# Patient Record
Sex: Male | Born: 1935 | Race: White | Hispanic: No | Marital: Married | Smoking: Never smoker
Health system: Southern US, Community
[De-identification: ages and names within clinical notes are randomized; demographics above are authoritative.]

## PROBLEM LIST (undated history)

## (undated) DIAGNOSIS — N400 Enlarged prostate without lower urinary tract symptoms: Secondary | ICD-10-CM

## (undated) DIAGNOSIS — E0590 Thyrotoxicosis, unspecified without thyrotoxic crisis or storm: Secondary | ICD-10-CM

## (undated) DIAGNOSIS — F32A Depression, unspecified: Secondary | ICD-10-CM

## (undated) DIAGNOSIS — K219 Gastro-esophageal reflux disease without esophagitis: Secondary | ICD-10-CM

## (undated) DIAGNOSIS — I340 Nonrheumatic mitral (valve) insufficiency: Secondary | ICD-10-CM

## (undated) DIAGNOSIS — Z22322 Carrier or suspected carrier of Methicillin resistant Staphylococcus aureus: Secondary | ICD-10-CM

## (undated) DIAGNOSIS — J45909 Unspecified asthma, uncomplicated: Secondary | ICD-10-CM

## (undated) DIAGNOSIS — G4733 Obstructive sleep apnea (adult) (pediatric): Secondary | ICD-10-CM

## (undated) DIAGNOSIS — I482 Chronic atrial fibrillation, unspecified: Secondary | ICD-10-CM

## (undated) DIAGNOSIS — Z950 Presence of cardiac pacemaker: Secondary | ICD-10-CM

## (undated) DIAGNOSIS — E559 Vitamin D deficiency, unspecified: Secondary | ICD-10-CM

## (undated) DIAGNOSIS — E119 Type 2 diabetes mellitus without complications: Secondary | ICD-10-CM

## (undated) DIAGNOSIS — F329 Major depressive disorder, single episode, unspecified: Secondary | ICD-10-CM

## (undated) DIAGNOSIS — G459 Transient cerebral ischemic attack, unspecified: Secondary | ICD-10-CM

## (undated) DIAGNOSIS — E785 Hyperlipidemia, unspecified: Secondary | ICD-10-CM

## (undated) DIAGNOSIS — C61 Malignant neoplasm of prostate: Secondary | ICD-10-CM

## (undated) HISTORY — PX: CHOLECYSTECTOMY: SHX55

## (undated) HISTORY — PX: INGUINAL HERNIA REPAIR: SUR1180

---

## 2004-07-09 ENCOUNTER — Encounter: Admission: RE | Admit: 2004-07-09 | Discharge: 2004-07-09 | Payer: Self-pay | Admitting: Urology

## 2005-08-15 ENCOUNTER — Ambulatory Visit: Payer: Self-pay | Admitting: Internal Medicine

## 2005-09-23 ENCOUNTER — Ambulatory Visit: Payer: Self-pay | Admitting: Internal Medicine

## 2005-09-23 ENCOUNTER — Inpatient Hospital Stay (HOSPITAL_COMMUNITY): Admission: RE | Admit: 2005-09-23 | Discharge: 2005-09-24 | Payer: Self-pay | Admitting: Internal Medicine

## 2005-10-08 ENCOUNTER — Ambulatory Visit: Payer: Self-pay

## 2005-10-15 ENCOUNTER — Ambulatory Visit: Payer: Self-pay | Admitting: Internal Medicine

## 2005-11-19 ENCOUNTER — Ambulatory Visit: Payer: Self-pay | Admitting: Cardiology

## 2006-01-02 ENCOUNTER — Ambulatory Visit: Payer: Self-pay | Admitting: Internal Medicine

## 2007-10-21 ENCOUNTER — Ambulatory Visit (HOSPITAL_COMMUNITY): Admission: RE | Admit: 2007-10-21 | Discharge: 2007-10-21 | Payer: Self-pay | Admitting: Urology

## 2008-12-28 ENCOUNTER — Encounter (INDEPENDENT_AMBULATORY_CARE_PROVIDER_SITE_OTHER): Payer: Self-pay | Admitting: *Deleted

## 2010-04-05 ENCOUNTER — Encounter (INDEPENDENT_AMBULATORY_CARE_PROVIDER_SITE_OTHER): Payer: Self-pay | Admitting: *Deleted

## 2010-10-10 NOTE — Letter (Signed)
Summary: Appointment - Reminder 2  [employer], Main Office  [address]   [place], [place] [address]   Phone: [phone]  Fax: [phone]     April 05, 2010 MRN: [id number]   DINA MOBLEY [address] [place], [place]  [address]   Dear Mr. Abdella,  Our records indicate that it is time to schedule a follow-up appointment.  Dr.Klein recommended that you follow up with [place]. It is very important that we reach you to schedule this appointment. We look forward to participating in your health care needs. Please contact us at the number listed above at your earliest convenience to schedule your appointment.  If you are unable to make an appointment at this time, give [place] a call so we can update our records.     Sincerely,  Ruel Favors Scheduling Team

## 2011-01-24 NOTE — Op Note (Signed)
NAME:  KJ, IMBERT NO.:  [id number]   MEDICAL RECORD NO.:  [id number]          PATIENT TYPE:  OIB   LOCATION:  4734                         FACILITY:  [facility]   PHYSICIAN:  Duke Salvia, M.D.  DATE OF BIRTH:  07-31-1929   DATE OF PROCEDURE:  09/23/2005  DATE OF DISCHARGE:                                 OPERATIVE REPORT   PREOPERATIVE DIAGNOSIS:  Tachy-brady syndrome.   POSTOPERATIVE DIAGNOSIS:  Tachy-brady syndrome.   OPERATION PERFORMED:  Single chamber pacemaker implantation.   SURGEON:  Duke Salvia, M.D.   DESCRIPTION OF PROCEDURE:  Following the obtaining of informed consent, the  patient was brought to the electrophysiology laboratory and placed on the  fluoroscopic table in supine position.  After routine prep and drape of the  left upper chest, lidocaine was infiltrated in the prepectoral subclavicular  region.  Incision was made and carried down to the layer of the prepectoral  fascia using the electrocautery and sharp dissection.  A pocket was formed  similarly.   Hemostasis was obtained.  Thereafter attention was turned to gaining access  to the extrathoracic left subclavian vein which was accomplished without  difficulty, without the aspiration of air or puncture of the artery.  A  guidewire was placed and retained.  A 7 French sheath was placed through  which was then passed a Medtronic 5076 58 cm active fixation ventricular  lead serial number [id number].  Under fluoroscopic guidance it was  manipulated to the right ventricular septum as defined in the LAO  projection.  In this location, the bipolar R-wave was initially 12 mV with  stable impedance.  This lead was then secured to the prepectoral fascia and  attached to a Medtronic Impulse [id number] pulse generator, serial number  [id number].  Just prior to attachment, the bipolar R-wave was 7.2 mV with a  pacing impedance of 659 ohms and pacing threshold of 0.6 V at 0.5 msec.  Current at  threshold is 1.1 mA.   With these acceptable parameters recorded, the lead was secured to the  device as noted.  The pocket was copiously irrigated with antibiotic  containing solution.  Hemostasis was assured and the lead and the pulse  generator were placed in the pocket and secured to the prepectoral fascia.  The wound was then closed in three layers in normal  fashion.  The wound was washed, dried and a benzoin Steri-Strip dressing was  applied.  Sponge count, needle count and instrument count were correct at  the end of the procedure according to the staff.  The patient tolerated the  procedure without apparent complication.           ______________________________  Duke Salvia, M.D.     SCK/MEDQ  D:  09/23/2005  T:  09/24/2005  Job:  [id number]   cc:   Heide Guile, MD  Fax: [phone]   Electrophys lab   [facility]

## 2011-01-24 NOTE — Discharge Summary (Signed)
NAME:  Patrick Drake, Patrick Drake NO.:  [id number]   MEDICAL RECORD NO.:  [id number]          PATIENT TYPE:  OIB   LOCATION:  4734                         FACILITY:  [facility]   PHYSICIAN:  Duke Salvia, M.D.  DATE OF BIRTH:  08-01-1929   DATE OF ADMISSION:  09/23/2005  DATE OF DISCHARGE:  09/24/2005                                 DISCHARGE SUMMARY   PRINCIPAL DIAGNOSES:  1.  Tachy-brady syndrome with Holter monitor pauses up to greater than 3      seconds.  2.  Chronic atrial fibrillation, chronic Coumadin.  3.  Implantation of Medtronic EnPulse single chamber pacemaker, September 23, 2005 Dr. Sherryl Manges.  4.  Elevated heart rates requiring addition of AV nodal blocking agents for      rate control this hospitalization.  5.  Chest x-ray September 23, 2005. There is a question of a small density at      the right base. This could easily be an artifact but follow up chest x-      ray or CT study is recommended if the feature, which is possibly a cold      right lower lobe nodule, persists on follow up chest x-ray. Patient to      consult with primary caregiver for this.   SECONDARY DIAGNOSES:  1.  Hyperthyroidism.  2.  Hypertension.  3.  Cardiolite study May 2006 which showed ejection fraction of 65%, normal      left ventricular wall thickness, normal left ventricular wall motion,      moderate mitral regurgitation.  4.  History of prostate cancer.  5.  Possible occult right lower lobe nodule on chest x-ray September 23, 2005.   ALLERGIES:  No known drug allergies.   PROCEDURES:  September 23, 2005 implantation of Medtronic single chamber  pacemaker, Dr. Sherryl Manges. Post procedure interrogation of the pacemaker  shows that all values were within normal limits. Chest x-ray has been  examined and shows the leads are appropriately placed. The incision without  hematoma.   HISTORY AND PHYSICAL:  Patrick Drake has a history of atrial fibrillation  which is  persistent. It has tachy-brady manifestations with actual pauses on  Holter monitor. He is currently treated for hyperthyroidism and hypertension  and is on chronic Coumadin. Cardiac evaluation included a stress  echocardiogram which was normal. Ejection fraction greater than 65%. This  was confirmed by a Myoview scan May 2006. The patient has been referred for  implantation of pacemaker for tachy-brady syndrome. The risks and benefits  have been described to the patient.   HOSPITAL COURSE:  The patient presented electively September 23, 2005. He  underwent implantation of Medtronic single chamber pacemaker. He has had  some elevated heart rate in the post pacemaker implantation period. He will  be placed on both atenolol and Inderal at discharge. His incision is healing  nicely without evidence of hematoma, erythema, or drainage. He is alert and  oriented x3 and continues in atrial fibrillation. PT and INR this admission  on January 17th it is 14.9 and INR 1.2.   Laboratory studies  this admission which were obtained on September 16, 2005 are  complete blood count white cells 6, hemoglobin 15.6, hematocrit 45.6,  platelets 144. The serum electrolytes of January 9 are sodium is 137,  potassium 5.1, chloride 102, carbonate 27, BUN is 13, creatinine 1.2,  glucose 86.   CC: Dr. Foye Deer, [facility], [place], [facility]      [place], [place].    ______________________________  Duke Salvia, M.D.    GM/MEDQ  D:  09/24/2005  T:  09/24/2005  Job:  [id number]   cc:   Duke Salvia, M.D.  [address]  [address]  [place]  [place] [address]   Heide Guile, MD  Fax: [phone]

## 2015-06-16 ENCOUNTER — Encounter (HOSPITAL_COMMUNITY): Payer: Self-pay | Admitting: Emergency Medicine

## 2015-06-16 ENCOUNTER — Inpatient Hospital Stay (HOSPITAL_COMMUNITY)
Admission: EM | Admit: 2015-06-16 | Discharge: 2015-06-19 | DRG: 556 | Disposition: A | Discharge status: Skilled Nursing Facility | Payer: Commercial Managed Care - HMO | Attending: Internal Medicine | Admitting: Internal Medicine

## 2015-06-16 ENCOUNTER — Emergency Department (HOSPITAL_COMMUNITY)
Admit: 2015-06-16 | Discharge: 2015-06-16 | Disposition: A | Discharge status: Home / Self Care | Payer: Commercial Managed Care - HMO | Attending: Emergency Medicine | Admitting: Emergency Medicine

## 2015-06-16 DIAGNOSIS — M7989 Other specified soft tissue disorders: Secondary | ICD-10-CM | POA: Diagnosis present

## 2015-06-16 DIAGNOSIS — M21331 Wrist drop, right wrist: Secondary | ICD-10-CM | POA: Diagnosis present

## 2015-06-16 DIAGNOSIS — Z7901 Long term (current) use of anticoagulants: Secondary | ICD-10-CM | POA: Diagnosis not present

## 2015-06-16 DIAGNOSIS — E0590 Thyrotoxicosis, unspecified without thyrotoxic crisis or storm: Secondary | ICD-10-CM

## 2015-06-16 DIAGNOSIS — Z923 Personal history of irradiation: Secondary | ICD-10-CM

## 2015-06-16 DIAGNOSIS — E785 Hyperlipidemia, unspecified: Secondary | ICD-10-CM | POA: Diagnosis present

## 2015-06-16 DIAGNOSIS — K219 Gastro-esophageal reflux disease without esophagitis: Secondary | ICD-10-CM | POA: Diagnosis present

## 2015-06-16 DIAGNOSIS — L02415 Cutaneous abscess of right lower limb: Secondary | ICD-10-CM | POA: Diagnosis present

## 2015-06-16 DIAGNOSIS — R2231 Localized swelling, mass and lump, right upper limb: Secondary | ICD-10-CM | POA: Diagnosis not present

## 2015-06-16 DIAGNOSIS — Z950 Presence of cardiac pacemaker: Secondary | ICD-10-CM

## 2015-06-16 DIAGNOSIS — Z66 Do not resuscitate: Secondary | ICD-10-CM | POA: Diagnosis present

## 2015-06-16 DIAGNOSIS — Z8673 Personal history of transient ischemic attack (TIA), and cerebral infarction without residual deficits: Secondary | ICD-10-CM | POA: Diagnosis not present

## 2015-06-16 DIAGNOSIS — M79601 Pain in right arm: Secondary | ICD-10-CM

## 2015-06-16 DIAGNOSIS — D649 Anemia, unspecified: Secondary | ICD-10-CM | POA: Diagnosis present

## 2015-06-16 DIAGNOSIS — E1122 Type 2 diabetes mellitus with diabetic chronic kidney disease: Secondary | ICD-10-CM

## 2015-06-16 DIAGNOSIS — B9561 Methicillin susceptible Staphylococcus aureus infection as the cause of diseases classified elsewhere: Secondary | ICD-10-CM

## 2015-06-16 DIAGNOSIS — I482 Chronic atrial fibrillation: Secondary | ICD-10-CM

## 2015-06-16 DIAGNOSIS — N189 Chronic kidney disease, unspecified: Secondary | ICD-10-CM

## 2015-06-16 DIAGNOSIS — I10 Essential (primary) hypertension: Secondary | ICD-10-CM

## 2015-06-16 DIAGNOSIS — R58 Hemorrhage, not elsewhere classified: Secondary | ICD-10-CM | POA: Diagnosis present

## 2015-06-16 DIAGNOSIS — Z8546 Personal history of malignant neoplasm of prostate: Secondary | ICD-10-CM

## 2015-06-16 DIAGNOSIS — N179 Acute kidney failure, unspecified: Secondary | ICD-10-CM | POA: Diagnosis present

## 2015-06-16 DIAGNOSIS — E559 Vitamin D deficiency, unspecified: Secondary | ICD-10-CM | POA: Insufficient documentation

## 2015-06-16 DIAGNOSIS — R609 Edema, unspecified: Secondary | ICD-10-CM

## 2015-06-16 DIAGNOSIS — I129 Hypertensive chronic kidney disease with stage 1 through stage 4 chronic kidney disease, or unspecified chronic kidney disease: Secondary | ICD-10-CM | POA: Diagnosis present

## 2015-06-16 DIAGNOSIS — I4891 Unspecified atrial fibrillation: Secondary | ICD-10-CM

## 2015-06-16 DIAGNOSIS — N400 Enlarged prostate without lower urinary tract symptoms: Secondary | ICD-10-CM | POA: Diagnosis present

## 2015-06-16 DIAGNOSIS — Z79899 Other long term (current) drug therapy: Secondary | ICD-10-CM

## 2015-06-16 DIAGNOSIS — J45909 Unspecified asthma, uncomplicated: Secondary | ICD-10-CM | POA: Insufficient documentation

## 2015-06-16 DIAGNOSIS — F32A Depression, unspecified: Secondary | ICD-10-CM | POA: Insufficient documentation

## 2015-06-16 DIAGNOSIS — F329 Major depressive disorder, single episode, unspecified: Secondary | ICD-10-CM | POA: Diagnosis present

## 2015-06-16 DIAGNOSIS — Z9989 Dependence on other enabling machines and devices: Secondary | ICD-10-CM

## 2015-06-16 DIAGNOSIS — Z794 Long term (current) use of insulin: Secondary | ICD-10-CM

## 2015-06-16 DIAGNOSIS — M21339 Wrist drop, unspecified wrist: Secondary | ICD-10-CM

## 2015-06-16 DIAGNOSIS — G4733 Obstructive sleep apnea (adult) (pediatric): Secondary | ICD-10-CM

## 2015-06-16 DIAGNOSIS — E119 Type 2 diabetes mellitus without complications: Secondary | ICD-10-CM

## 2015-06-16 DIAGNOSIS — C61 Malignant neoplasm of prostate: Secondary | ICD-10-CM | POA: Insufficient documentation

## 2015-06-16 DIAGNOSIS — N289 Disorder of kidney and ureter, unspecified: Secondary | ICD-10-CM

## 2015-06-16 HISTORY — DX: Chronic atrial fibrillation, unspecified: I48.20

## 2015-06-16 HISTORY — DX: Unspecified asthma, uncomplicated: J45.909

## 2015-06-16 HISTORY — DX: Malignant neoplasm of prostate: C61

## 2015-06-16 HISTORY — DX: Nonrheumatic mitral (valve) insufficiency: I34.0

## 2015-06-16 HISTORY — DX: Presence of cardiac pacemaker: Z95.0

## 2015-06-16 HISTORY — DX: Transient cerebral ischemic attack, unspecified: G45.9

## 2015-06-16 HISTORY — DX: Thyrotoxicosis, unspecified without thyrotoxic crisis or storm: E05.90

## 2015-06-16 HISTORY — DX: Depression, unspecified: F32.A

## 2015-06-16 HISTORY — DX: Type 2 diabetes mellitus without complications: E11.9

## 2015-06-16 HISTORY — DX: Carrier or suspected carrier of methicillin resistant Staphylococcus aureus: Z22.322

## 2015-06-16 HISTORY — DX: Hyperlipidemia, unspecified: E78.5

## 2015-06-16 HISTORY — DX: Benign prostatic hyperplasia without lower urinary tract symptoms: N40.0

## 2015-06-16 HISTORY — DX: Obstructive sleep apnea (adult) (pediatric): G47.33

## 2015-06-16 HISTORY — DX: Vitamin D deficiency, unspecified: E55.9

## 2015-06-16 HISTORY — DX: Major depressive disorder, single episode, unspecified: F32.9

## 2015-06-16 HISTORY — DX: Gastro-esophageal reflux disease without esophagitis: K21.9

## 2015-06-16 LAB — BASIC METABOLIC PANEL
Anion gap: 9 (ref 5–15)
BUN: 31 mg/dL — ABNORMAL HIGH (ref 6–20)
CO2: 26 mmol/L (ref 22–32)
Calcium: 9.1 mg/dL (ref 8.9–10.3)
Chloride: 103 mmol/L (ref 101–111)
Creatinine, Ser: 1.46 mg/dL — ABNORMAL HIGH (ref 0.61–1.24)
GFR calc Af Amer: 49 mL/min — ABNORMAL LOW (ref >60)
GFR calc non Af Amer: 42 mL/min — ABNORMAL LOW (ref >60)
Glucose, Bld: 111 mg/dL — ABNORMAL HIGH (ref 65–99)
Potassium: 4 mmol/L (ref 3.5–5.1)
Sodium: 138 mmol/L (ref 135–145)

## 2015-06-16 LAB — CBC
HCT: 29.2 % — ABNORMAL LOW (ref 39.0–52.0)
HEMOGLOBIN: 9.3 g/dL — AB (ref 13.0–17.0)
MCH: 31.4 pg (ref 26.0–34.0)
MCHC: 31.8 g/dL (ref 30.0–36.0)
MCV: 98.6 fL (ref 78.0–100.0)
Platelets: 252 10*3/uL (ref 150–400)
RBC: 2.96 MIL/uL — AB (ref 4.22–5.81)
RDW: 18 % — ABNORMAL HIGH (ref 11.5–15.5)
WBC: 8.1 10*3/uL (ref 4.0–10.5)

## 2015-06-16 LAB — GLUCOSE, CAPILLARY
GLUCOSE-CAPILLARY: 93 mg/dL (ref 65–99)
GLUCOSE-CAPILLARY: 175 mg/dL — AB (ref 65–99)
GLUCOSE-CAPILLARY: 159 mg/dL — AB (ref 65–99)
Glucose-Capillary: 116 mg/dL — ABNORMAL HIGH (ref 65–99)
Glucose-Capillary: 47 mg/dL — ABNORMAL LOW (ref 65–99)

## 2015-06-16 LAB — ABO/RH: ABO/RH(D): A NEG

## 2015-06-16 LAB — CBC WITH DIFFERENTIAL/PLATELET
Basophils Absolute: 0 10*3/uL (ref 0.0–0.1)
Basophils Relative: 0 %
Eosinophils Absolute: 0.1 10*3/uL (ref 0.0–0.7)
Eosinophils Relative: 1 %
HCT: 23.5 % — ABNORMAL LOW (ref 39.0–52.0)
Hemoglobin: 7.5 g/dL — ABNORMAL LOW (ref 13.0–17.0)
Lymphocytes Relative: 15 %
Lymphs Abs: 1.1 10*3/uL (ref 0.7–4.0)
MCH: 31.9 pg (ref 26.0–34.0)
MCHC: 31.9 g/dL (ref 30.0–36.0)
MCV: 100 fL (ref 78.0–100.0)
Monocytes Absolute: 0.5 10*3/uL (ref 0.1–1.0)
Monocytes Relative: 7 %
Neutro Abs: 5.7 10*3/uL (ref 1.7–7.7)
Neutrophils Relative %: 77 %
Platelets: 265 10*3/uL (ref 150–400)
RBC: 2.35 MIL/uL — ABNORMAL LOW (ref 4.22–5.81)
RDW: 18 % — ABNORMAL HIGH (ref 11.5–15.5)
WBC: 7.4 10*3/uL (ref 4.0–10.5)

## 2015-06-16 LAB — PREPARE RBC (CROSSMATCH)

## 2015-06-16 LAB — CBG MONITORING, ED
Glucose-Capillary: 68 mg/dL (ref 65–99)
Glucose-Capillary: 109 mg/dL — ABNORMAL HIGH (ref 65–99)

## 2015-06-16 LAB — PROTIME-INR
INR: 1.92 — ABNORMAL HIGH (ref 0.00–1.49)
Prothrombin Time: 21.9 seconds — ABNORMAL HIGH (ref 11.6–15.2)

## 2015-06-16 LAB — MRSA PCR SCREENING: MRSA by PCR: NEGATIVE

## 2015-06-16 MED ORDER — VENLAFAXINE HCL ER 75 MG PO CP24
75.0000 mg | ORAL_CAPSULE | Freq: Every day | ORAL | Status: DC
  Administered 2015-06-16 – 2015-06-19 (×4): 75 mg via ORAL
  Filled 2015-06-16 (×5): qty 1

## 2015-06-16 MED ORDER — INSULIN DETEMIR 100 UNIT/ML ~~LOC~~ SOLN
20.0000 [IU] | Freq: Every day | SUBCUTANEOUS | Status: DC
  Administered 2015-06-16 – 2015-06-17 (×2): 20 [IU] via SUBCUTANEOUS
  Filled 2015-06-16 (×3): qty 0.2

## 2015-06-16 MED ORDER — FLUTICASONE FUROATE-VILANTEROL 200-25 MCG/INH IN AEPB: 1.0000 | INHALATION_SPRAY | Freq: Every day | RESPIRATORY_TRACT | Status: DC

## 2015-06-16 MED ORDER — INSULIN ASPART 100 UNIT/ML ~~LOC~~ SOLN: 0.0000 [IU] | Freq: Three times a day (TID) | SUBCUTANEOUS | Status: DC

## 2015-06-16 MED ORDER — FUROSEMIDE 40 MG PO TABS
40.0000 mg | ORAL_TABLET | Freq: Every day | ORAL | Status: DC
  Administered 2015-06-16: 40 mg via ORAL
  Filled 2015-06-16: qty 1

## 2015-06-16 MED ORDER — DEXTROSE 50 % IV SOLN
INTRAVENOUS | Status: AC
  Administered 2015-06-16: 50 mL
  Filled 2015-06-16: qty 50

## 2015-06-16 MED ORDER — INSULIN DETEMIR 100 UNIT/ML ~~LOC~~ SOLN
40.0000 [IU] | Freq: Every day | SUBCUTANEOUS | Status: DC
  Filled 2015-06-16: qty 0.4

## 2015-06-16 MED ORDER — CLINDAMYCIN HCL 300 MG PO CAPS
600.0000 mg | ORAL_CAPSULE | Freq: Three times a day (TID) | ORAL | Status: AC
  Administered 2015-06-16 (×2): 600 mg via ORAL
  Filled 2015-06-16 (×2): qty 2

## 2015-06-16 MED ORDER — METHIMAZOLE 5 MG PO TABS
5.0000 mg | ORAL_TABLET | ORAL | Status: DC
Start: 2015-06-17 — End: 2015-06-19
  Administered 2015-06-17 – 2015-06-19 (×2): 5 mg via ORAL
  Filled 2015-06-16 (×3): qty 1

## 2015-06-16 MED ORDER — RISAQUAD PO CAPS
1.0000 | ORAL_CAPSULE | Freq: Two times a day (BID) | ORAL | Status: DC
  Administered 2015-06-16 – 2015-06-18 (×5): 1 via ORAL
  Filled 2015-06-16 (×5): qty 1

## 2015-06-16 MED ORDER — ALBUTEROL SULFATE (2.5 MG/3ML) 0.083% IN NEBU: 3.0000 mL | INHALATION_SOLUTION | Freq: Four times a day (QID) | RESPIRATORY_TRACT | Status: DC | PRN

## 2015-06-16 MED ORDER — FLORA-Q PO CAPS
1.0000 | ORAL_CAPSULE | Freq: Two times a day (BID) | ORAL | Status: DC
  Filled 2015-06-16: qty 1

## 2015-06-16 MED ORDER — SODIUM CHLORIDE 0.9 % IV SOLN
Freq: Once | INTRAVENOUS | Status: AC
  Administered 2015-06-16: 11:00:00 via INTRAVENOUS

## 2015-06-16 MED ORDER — SODIUM CHLORIDE 0.9 % IV SOLN
Freq: Once | INTRAVENOUS | Status: AC
  Administered 2015-06-16: 16:00:00 via INTRAVENOUS

## 2015-06-16 MED ORDER — ATENOLOL 50 MG PO TABS
75.0000 mg | ORAL_TABLET | Freq: Every day | ORAL | Status: DC
  Administered 2015-06-17 – 2015-06-19 (×3): 75 mg via ORAL
  Filled 2015-06-16 (×4): qty 2

## 2015-06-16 MED ORDER — GUAIFENESIN-DM 100-10 MG/5ML PO SYRP: 10.0000 mL | ORAL_SOLUTION | Freq: Four times a day (QID) | ORAL | Status: DC | PRN

## 2015-06-16 MED ORDER — DEXTROSE 50 % IV SOLN
12.5000 g | Freq: Once | INTRAVENOUS | Status: AC
  Administered 2015-06-16: 12.5 g via INTRAVENOUS

## 2015-06-16 MED ORDER — FAMOTIDINE 20 MG PO TABS
20.0000 mg | ORAL_TABLET | Freq: Every day | ORAL | Status: DC
  Administered 2015-06-16 – 2015-06-19 (×4): 20 mg via ORAL
  Filled 2015-06-16 (×5): qty 1

## 2015-06-16 MED ORDER — ALUM & MAG HYDROXIDE-SIMETH 200-200-20 MG/5ML PO SUSP: 30.0000 mL | ORAL | Status: DC | PRN

## 2015-06-16 MED ORDER — INFLUENZA VAC SPLIT QUAD 0.5 ML IM SUSY
0.5000 mL | PREFILLED_SYRINGE | INTRAMUSCULAR | Status: AC
  Administered 2015-06-19: 0.5 mL via INTRAMUSCULAR
  Filled 2015-06-16 (×2): qty 0.5

## 2015-06-16 MED ORDER — ACETAMINOPHEN 650 MG RE SUPP: 650.0000 mg | Freq: Four times a day (QID) | RECTAL | Status: DC | PRN

## 2015-06-16 MED ORDER — INSULIN ASPART 100 UNIT/ML ~~LOC~~ SOLN: 0.0000 [IU] | Freq: Every day | SUBCUTANEOUS | Status: DC

## 2015-06-16 MED ORDER — ATORVASTATIN CALCIUM 40 MG PO TABS
40.0000 mg | ORAL_TABLET | Freq: Every day | ORAL | Status: DC
  Administered 2015-06-16 – 2015-06-19 (×4): 40 mg via ORAL
  Filled 2015-06-16 (×4): qty 1

## 2015-06-16 MED ORDER — ACETAMINOPHEN 325 MG PO TABS: 650.0000 mg | ORAL_TABLET | Freq: Four times a day (QID) | ORAL | Status: DC | PRN

## 2015-06-16 MED ORDER — MOMETASONE FURO-FORMOTEROL FUM 100-5 MCG/ACT IN AERO
2.0000 | INHALATION_SPRAY | Freq: Two times a day (BID) | RESPIRATORY_TRACT | Status: DC
  Administered 2015-06-16 – 2015-06-18 (×5): 2 via RESPIRATORY_TRACT
  Filled 2015-06-16: qty 8.8

## 2015-06-16 NOTE — H&P (Signed)
Date: 06/16/2015               Patient Name:  Patrick Drake MRN: [id number]  DOB: 1929-08-16 Age / Sex: 81 y.o., male   PCP: No primary care provider on file.         Medical Service: Internal Medicine Teaching Service         Attending Physician: Dr. Michel Bickers, MD    First Contact: Dr. Charlynn Grimes Pager: [phone]  Second Contact: Dr. Marvel Plan Pager: [phone]       After Hours (After 5p/  First Contact Pager: [phone]  weekends / holidays): Second Contact Pager: [phone]   Chief Complaint: Right arm swelling  History of Present Illness: [name] is an 81 year old male with a PMH of atrial fibrillation on warfarin, insulin dependent diabetes mellitus type 2, hypertension, TIA, hyperlipidemia and hyperthyroidism who presents to [facility] ED with 1 week history of right arm swelling. Patient reports that over the past week he developed a knot on the back right arm that began as a bruise with progressive swelling. He denies any pain. His right hand became swollen with purple discoloration in the past day. His right arm was noted to be purple, cool to the touch with a weak radial pulse. He had an US done which was read as a brachial artery occulusion and sent to the ED.  Upon arrival, vascular duplex scan was read by the ED physician, Dr. Roxanne Mins, as an anechoic area with no evidence of arterial occulusion. Denies any trauma.   Venous and arterial duplex of the right arm in the ED show no evidence of DVT or SVT with no stenosis and normal waveforms throughout.   Patient was recently hospitalized. Cannot recall the details of the hospitalization. Appears he had an abscess on his right upper thigh that was drained. Wound vac in place. He is on day 10 of clindamycin per nursing facility medication list. He is a resident of [facility] in [place].   Meds: No current facility-administered medications for this encounter.    Allergies: Allergies as of 06/16/2015  . (No Known Allergies)    Past Medical History  Diagnosis Date  . Diabetes mellitus without complication ([place])   . TIA (transient ischemic attack)   . MI (mitral incompetence)   . MRSA (methicillin resistant staph aureus) culture positive   . Depression   . Atrial fibrillation, chronic ([place])   . Pacemaker   . Hyperlipemia   . HTN (hypertension)   . BPH (benign prostatic hyperplasia)   . Prostate CA [facility])     s/p radiation 2009  . GERD (gastroesophageal reflux disease)   . Hyperthyroidism   . OSA (obstructive sleep apnea)   . Vitamin D deficiency   . Asthma    Past Surgical History  Procedure Laterality Date  . Cholecystectomy    . Inguinal hernia repair     No family history on file. Social History   Social History  . Marital Status: Married    Spouse Name: N/A  . Number of Children: N/A  . Years of Education: N/A   Occupational History  . Not on file.   Social History Main Topics  . Smoking status: Not on file  . Smokeless tobacco: Not on file  . Alcohol Use: Not on file  . Drug Use: Not on file  . Sexual Activity: Not on file   Other Topics Concern  . Not on file   Social History Narrative  .  No narrative on file   Review of Systems: Review of Systems  Constitutional: Positive for chills. Negative for fever and weight loss.  Eyes: Negative for blurred vision and double vision.  Respiratory: Negative for cough, shortness of breath and wheezing.   Cardiovascular: Negative for chest pain and palpitations.  Gastrointestinal: Negative for nausea, vomiting, abdominal pain, diarrhea and constipation.  Genitourinary: Negative for dysuria and hematuria.  Skin: Negative for itching and rash.  Neurological: Positive for headaches. Negative for dizziness, focal weakness and weakness.  Endo/Heme/Allergies: Bruises/bleeds easily.  Psychiatric/Behavioral: Negative for depression.   Physical Exam: Blood pressure 134/63, pulse 69, temperature 97.6 F (36.4 C), temperature source Oral,  resp. rate 19, SpO2 97 %. General appearance: alert, cooperative, appears stated age and no distress Head: Normocephalic, without obvious abnormality, atraumatic Eyes: conjunctivae/corneas clear. PERRL, EOM's intact. Throat: lips, mucosa, and tongue normal; teeth and gums normal Neck: no adenopathy, no carotid bruit and no JVD Back: symmetric, no curvature. ROM normal. No CVA tenderness. Lungs: clear to auscultation bilaterally, no rales or wheezing Heart: regular rate and rhythm, S1, S2 normal, no murmur, click, rub or gallop Abdomen: soft, non-tender; bowel sounds normal; no masses,  no organomegaly Pulses: 2+ and symmetric Skin: Warm, dry. Right thigh wound vac in place.  Neurologic: Alert and oriented X 3, normal strength and tone. Normal symmetric reflexes. Normal coordination and gait Extremities: Right arm swollen with ecchymosis to shoulder. Non-tender to palpation. Limited range of movement 2/2 pain in forearm with movement. Sensation intact. No masses palpated. Right hand cool to touch but normal capillary refill. 1+ radial pulse.      Lab results: Basic Metabolic Panel:  Recent Labs  06/16/15 0650  NA 138  K 4.0  CL 103  CO2 26  GLUCOSE 111*  BUN 31*  CREATININE 1.46*  CALCIUM 9.1   CBC:  Recent Labs  06/16/15 0650  WBC 7.4  NEUTROABS 5.7  HGB 7.5*  HCT 23.5*  MCV 100.0  PLT 265   CBG:  Recent Labs  06/16/15 0806 06/16/15 0933  GLUCAP 68 109*   Coagulation:  Recent Labs  06/16/15 0650  LABPROT 21.9*  INR 1.92*   Imaging results:  No results found.  Assessment & Plan by Problem:  [name] is an 81 year old male with a history of A. Fib on warfarin presents with 1 week history of right arm swelling and ecchymosis.   Swelling and Ecchymosis in RUE: [name] has progressive RUE swelling with ecchymosis for the past week. He is on chronic anticoagulation therapy for his A. Fib. He was recently admitted to [facility] in  [place], [place] for a RLE MRSA abscess that was drained. Dopplers done today show no evidence of DVT or SVT with no stenosis and normal waveforms. Likely that he is having a chronic bleed in that arm causing the swelling. Possible that he had an IV site in that arm during previous hospitalization that is the root cause of the bleed. It appears his coumadin was held prior to I&D and he was discharged with a Lovenox bridge until his INR returned to therapeutic range. INR today was 1.92. Discharge Hgb was 11.4. Hgb today at 7.5. No symptoms concerning for compartment syndrome.  -Will give 2 units PRBC for suspected active bleed -Daily CBC -Holding warfarin -Daily INR  Normochromic normocytic anemia: per above. Likely active bleeding in right arm. Holding anti-coaguation. Trend H/H. Will get 2 units PRBC.  DM (diabetes mellitus) type 2: Last A1c 8.1  in September 2016. Patient's home regimen is Levemir 50 units bid with glimepridie 4 mg daily. CBGs have been low here. Will monitor closely.  -Levemir 40 units qhs -SSI-moderate -CBGs qac and qhs   Atrial fibrillation: Patient with chronic A. Fib on warfarin. Warfarin temporarily held at last hospitalization prior to I&D.  AKI (acute kidney injury) on CKD: Patient's baseline Cr is around 1.3. Mildly elevated at 1.46 on admission. Will try to get further records from [facility].  -Daily BMET -Monitor  Hyperthyroidism: On methimazole 5 mg daily.  -Check TSH -Continue home meds  OSA (obstructive sleep apnea): CPAP qhs  Right Upper Leg Abscess: Was recently hospitalized for right upper leg abscess in his medial thigh.   FEN: Carb modified diet  DVT PPx: SCDs  CODE: DNR/DNI  Dispo: Disposition is deferred at this time, awaiting improvement of current medical problems.   The patient does not have a current PCP (No primary care provider on file.) and does need an [facility] hospital follow-up appointment after discharge.  The patient does have  transportation limitations that hinder transportation to clinic appointments.  Signed: Maryellen Pile, MD 06/16/2015, 10:17 AM

## 2015-06-16 NOTE — Progress Notes (Signed)
VASCULAR LAB PRELIMINARY  PRELIMINARY  PRELIMINARY  PRELIMINARY  Right upper extremity venous duplex completed.    Preliminary report:  There is no obvious evidence of DVT or SVT noted in the right upper extremity.  Maryna Yeagle, RVT 06/16/2015, 8:43 AM

## 2015-06-16 NOTE — Progress Notes (Signed)
VASCULAR LAB PRELIMINARY  PRELIMINARY  PRELIMINARY  PRELIMINARY  Arterial duplex of right upper extremity completed.    Preliminary report:  There is no stenosis noted in the right upper extremity.  Waveforms appear normal throughout.    Becci Batty, RVT 06/16/2015, 8:58 AM

## 2015-06-16 NOTE — ED Notes (Signed)
Pt transported to Vascular 

## 2015-06-16 NOTE — ED Notes (Signed)
Pt given 8 ounces of orange juice per Dr. Roxanne Mins.

## 2015-06-16 NOTE — ED Notes (Signed)
Pt CBG, 109. Nurse was notified.

## 2015-06-16 NOTE — Progress Notes (Signed)
Report received form Georga Hacking

## 2015-06-16 NOTE — ED Provider Notes (Signed)
CSN: [id number]     Arrival date & time 06/16/15  6948 History   First MD Initiated Contact with Patient 06/16/15 [phone]     Chief Complaint  Patient presents with  . Cold Extremity     (Consider location/radiation/quality/duration/timing/severity/associated sxs/prior Treatment) The history is provided by the patient and the nursing home.   81 year old male was sent from nursing home because of concern about arterial occlusion. He states that his right arm has been swollen for about the last week and his right hand got swollen and purple discoloration over the last 24 hours. He is sent with a vascular duplex scan of his arm which shows an anechoic area in the upper arm but actually shows no evidence of arterial occlusion. He denies any trauma. He is anticoagulated because of atrial fibrillation. According to the [facility], he had is currently on warfarin and I did also been on enoxaparin until 4 days ago.  Past Medical History  Diagnosis Date  . Diabetes mellitus without complication ([place])   . TIA (transient ischemic attack)   . MI (mitral incompetence)   . MRSA (methicillin resistant staph aureus) culture positive   . Depression   . Atrial fibrillation, chronic ([place])   . Pacemaker   . Hyperlipemia    No past surgical history on file. No family history on file. Social History  Substance Use Topics  . Smoking status: Not on file  . Smokeless tobacco: Not on file  . Alcohol Use: Not on file    Review of Systems  Unable to perform ROS All other systems reviewed and are negative.     Allergies  Review of patient's allergies indicates no known allergies.  Home Medications   Prior to Admission medications   Not on File   BP 112/61 mmHg  Pulse 82  Temp(Src) 97.6 F (36.4 C) (Oral)  Resp 13  SpO2 98% Physical Exam 81 year old male, resting comfortably and in no acute distress. Vital signs are normal. Oxygen saturation is 98%, which is normal. Head is normocephalic and  atraumatic. PERRLA, EOMI. Oropharynx is clear. Neck is nontender and supple without adenopathy or JVD. Back is nontender and there is no CVA tenderness. Lungs are clear without rales, wheezes, or rhonchi. Chest is nontender. Heart has regular rate and rhythm without murmur. Abdomen is soft, flat, nontender without masses or hepatosplenomegaly and peristalsis is normoactive. Extremities: Right arm has swelling and ecchymosis which starts at the shoulder. The upper arm ecchymosis ask he appears several days old and the right hand ecchymosis appears more recent. There is mild tenderness to palpation but I cannot detect any cords or any discrete masses. The right hand is clean but not cold and capillary refill is prompt. He does appear to have a wrist drop. Skin is warm and dry without rash. Neurologic: Mental status is normal, cranial nerves are intact, there are no motor or sensory deficits except as noted above.  ED Course  Procedures (including critical care time) Labs Review Results for orders placed or performed during the hospital encounter of 70/35/00  Basic metabolic panel  Result Value Ref Range   Sodium 138 135 - 145 mmol/L   Potassium 4.0 3.5 - 5.1 mmol/L   Chloride 103 101 - 111 mmol/L   CO2 26 22 - 32 mmol/L   Glucose, Bld 111 (H) 65 - 99 mg/dL   BUN 31 (H) 6 - 20 mg/dL   Creatinine, Ser 1.46 (H) 0.61 - 1.24 mg/dL   Calcium 9.1  8.9 - 10.3 mg/dL   GFR calc non Af Amer 42 (L) >60 mL/min   GFR calc Af Amer 49 (L) >60 mL/min   Anion gap 9 5 - 15  Protime-INR  Result Value Ref Range   Prothrombin Time 21.9 (H) 11.6 - 15.2 seconds   INR 1.92 (H) 0.00 - 1.49  CBC with Differential  Result Value Ref Range   WBC 7.4 4.0 - 10.5 K/uL   RBC 2.35 (L) 4.22 - 5.81 MIL/uL   Hemoglobin 7.5 (L) 13.0 - 17.0 g/dL   HCT 23.5 (L) 39.0 - 52.0 %   MCV 100.0 78.0 - 100.0 fL   MCH 31.9 26.0 - 34.0 pg   MCHC 31.9 30.0 - 36.0 g/dL   RDW 18.0 (H) 11.5 - 15.5 %   Platelets 265 150 - 400 K/uL    Neutrophils Relative % 77 %   Neutro Abs 5.7 1.7 - 7.7 K/uL   Lymphocytes Relative 15 %   Lymphs Abs 1.1 0.7 - 4.0 K/uL   Monocytes Relative 7 %   Monocytes Absolute 0.5 0.1 - 1.0 K/uL   Eosinophils Relative 1 %   Eosinophils Absolute 0.1 0.0 - 0.7 K/uL   Basophils Relative 0 %   Basophils Absolute 0.0 0.0 - 0.1 K/uL  CBG monitoring, ED  Result Value Ref Range   Glucose-Capillary 68 65 - 99 mg/dL   I have personally reviewed and evaluated these lab results as part of my medical decision-making.   MDM   Final diagnoses:  Swelling in right armpit  Normochromic normocytic anemia  Renal insufficiency  Anticoagulated on warfarin  Right wrist drop    Swelling of the right arm. I suspect that this is a spontaneous bleeding related to his anticoagulated state. INR will be checked as will hemoglobin. He will also be sent for venous and arterial duplex studies of the arm.  Arterial venous duplex studies are still pending. Hemoglobin has come back at 7.5. I can find no report of hemoglobin on the report from the nursing home that he was transferred from an there are no prior records in the [place] system. Mild renal insufficiency is noted and he does carry a diagnosis of renal disease I doubt that this is acute. I suspect anemia is of these partly from blood loss into his arm. He is likely to need transfusion given how low his hemoglobin is. Case is discussed with Dr. Marvel Plan of internal medicine teaching service who agrees to admit the patient.  Delora Fuel, MD 33/00/76 [id number]

## 2015-06-16 NOTE — Progress Notes (Signed)
Patient states he does not wear CPAP at home. RT will continue to monitor as needed.

## 2015-06-16 NOTE — ED Notes (Signed)
Pt in [facility] from [facility] in [place]. 3-4 days ago pt developed knot on back of arm that started with a bruise, now entire R arm is purple/blue, cool to the touch, has weak radial pulse. Pt had US done which showed brachial artery was completely occluded. CBG was 45 en route, given 1/2 amp D50 and CBG now 135.

## 2015-06-17 DIAGNOSIS — Z9889 Other specified postprocedural states: Secondary | ICD-10-CM

## 2015-06-17 LAB — GLUCOSE, CAPILLARY
GLUCOSE-CAPILLARY: 74 mg/dL (ref 65–99)
GLUCOSE-CAPILLARY: 74 mg/dL (ref 65–99)
GLUCOSE-CAPILLARY: 133 mg/dL — AB (ref 65–99)
Glucose-Capillary: 112 mg/dL — ABNORMAL HIGH (ref 65–99)
Glucose-Capillary: 189 mg/dL — ABNORMAL HIGH (ref 65–99)
Glucose-Capillary: 207 mg/dL — ABNORMAL HIGH (ref 65–99)

## 2015-06-17 LAB — BASIC METABOLIC PANEL
Anion gap: 6 (ref 5–15)
BUN: 25 mg/dL — AB (ref 6–20)
CHLORIDE: 99 mmol/L — AB (ref 101–111)
CO2: 31 mmol/L (ref 22–32)
Calcium: 8.9 mg/dL (ref 8.9–10.3)
Creatinine, Ser: 1.43 mg/dL — ABNORMAL HIGH (ref 0.61–1.24)
GFR calc Af Amer: 50 mL/min — ABNORMAL LOW (ref >60)
GFR calc non Af Amer: 43 mL/min — ABNORMAL LOW (ref >60)
GLUCOSE: 63 mg/dL — AB (ref 65–99)
POTASSIUM: 4.1 mmol/L (ref 3.5–5.1)
Sodium: 136 mmol/L (ref 135–145)

## 2015-06-17 LAB — CBC
HEMATOCRIT: 29.1 % — AB (ref 39.0–52.0)
HEMOGLOBIN: 9.6 g/dL — AB (ref 13.0–17.0)
MCH: 32.7 pg (ref 26.0–34.0)
MCHC: 33 g/dL (ref 30.0–36.0)
MCV: 99 fL (ref 78.0–100.0)
Platelets: 227 10*3/uL (ref 150–400)
RBC: 2.94 MIL/uL — AB (ref 4.22–5.81)
RDW: 18 % — ABNORMAL HIGH (ref 11.5–15.5)
WBC: 7.2 10*3/uL (ref 4.0–10.5)

## 2015-06-17 LAB — PROTIME-INR
INR: 0.77 (ref 0.00–1.49)
Prothrombin Time: 11.1 seconds — ABNORMAL LOW (ref 11.6–15.2)

## 2015-06-17 LAB — TSH: TSH: 2.937 u[IU]/mL (ref 0.350–4.500)

## 2015-06-17 MED ORDER — WARFARIN - PHARMACIST DOSING INPATIENT
Freq: Every day | Status: DC
Start: 2015-06-17 — End: 2015-06-19
  Administered 2015-06-17: 18:00:00

## 2015-06-17 MED ORDER — FLUCONAZOLE 100 MG PO TABS
150.0000 mg | ORAL_TABLET | Freq: Once | ORAL | Status: AC
  Administered 2015-06-17: 150 mg via ORAL
  Filled 2015-06-17: qty 2

## 2015-06-17 MED ORDER — WARFARIN SODIUM 5 MG PO TABS
5.0000 mg | ORAL_TABLET | Freq: Once | ORAL | Status: AC
  Administered 2015-06-17: 5 mg via ORAL
  Filled 2015-06-17: qty 1

## 2015-06-17 NOTE — Consult Note (Signed)
WOC wound consult note Reason for Consult: Chronic non-healing wound on right medial thigh Wound type:Full thickness Pressure Ulcer POA: No Measurement:2cm x 5cm x 0.2 cm in 80% of wound bed, small amount of necrotic tissue in wound base prevents me from determining overall depth. Wound bed: As described above, 80% of wound bed is red, moist and with capillary buds present. 20% with area of soft necrotic tissue.  Drainage (amount, consistency, odor) Serous, moderate amount, no odor Periwound: Intact with evidence of previous wound healing Dressing procedure/placement/frequency: Surface level wound with minimal depth is no longer appropriate for NPWT.  We will discontinue today as it was removed sometime during the night.  MD in to see with me today, we note fungal overgrowth in the right inguinal crease.  She has provided a one-time order for a systemic antifungal today. Topical wound care will not transition to a silver hydrofiber (Aquacel Ag+) for moisture/exudate management and an antimicrobial environment conducive to wound healing. We will initiate therapy at once daily, but upon discharge this frequency may be decreased to three times weekly. Urinary incontinence may be a complicating factor; patient is currently using an external catheter for this condition. [facility] nursing team will not follow, but will remain available to this patient, the nursing and medical teams.  Please re-consult if needed. Thanks, Maudie Flakes, MSN, RN, [place], [place], [place] [phone])

## 2015-06-17 NOTE — Discharge Summary (Signed)
Name: Patrick Drake MRN: [id number] DOB: Dec 05, 1928 81 y.o. PCP: No primary care provider on file.  Date of Admission: 06/16/2015  6:25 AM Date of Discharge: 06/18/2015 Attending Physician: Sid Falcon, MD  Discharge Diagnosis: Principal Problem:   Swelling of right upper extremity Active Problems:   Normochromic normocytic anemia   DM (diabetes mellitus) (HCC)   Atrial fibrillation (HCC)   AKI (acute kidney injury) ([place])   OSA (obstructive sleep apnea)   Abscess of right leg   Venous bleed  Discharge Medications:   Medication List    STOP taking these medications        clindamycin 300 MG capsule  Commonly known as:  CLEOCIN     glimepiride 4 MG tablet  Commonly known as:  AMARYL     guaiFENesin-dextromethorphan 100-10 MG/5ML syrup  Commonly known as:  ROBITUSSIN DM     loperamide 2 MG tablet  Commonly known as:  IMODIUM A-D     magnesium hydroxide 400 MG/5ML suspension  Commonly known as:  MILK OF MAGNESIA      TAKE these medications        acetaminophen 500 MG tablet  Commonly known as:  TYLENOL  Take 1,000 mg by mouth every 4 (four) hours as needed for mild pain.     albuterol 108 (90 BASE) MCG/ACT inhaler  Commonly known as:  PROVENTIL HFA;VENTOLIN HFA  Inhale 2 puffs into the lungs every 6 (six) hours as needed for wheezing or shortness of breath.     alum & mag hydroxide-simeth 200-200-20 MG/5ML suspension  Commonly known as:  MAALOX/MYLANTA  Take 30 mLs by mouth as needed for indigestion or heartburn.     aspirin 81 MG EC tablet  Take 1 tablet (81 mg total) by mouth as needed (for a-fib).     atenolol 50 MG tablet  Commonly known as:  TENORMIN  Take 75 mg by mouth daily.     atorvastatin 40 MG tablet  Commonly known as:  LIPITOR  Take 40 mg by mouth daily.     BREO ELLIPTA 200-25 MCG/INH Aepb  Generic drug:  Fluticasone Furoate-Vilanterol  Inhale 1 puff into the lungs daily.     FLORA-Q Caps capsule  Take 1 capsule by mouth  2 (two) times daily.     furosemide 40 MG tablet  Commonly known as:  LASIX  Take 40 mg by mouth daily.     hydrocortisone cream 1 %  Apply 1 application topically every 6 (six) hours as needed for itching.     insulin detemir 100 UNIT/ML injection  Commonly known as:  LEVEMIR  Inject 0.15 mLs (15 Units total) into the skin at bedtime.     Magnesium 250 MG Tabs  Take 250 mg by mouth daily.     methimazole 5 MG tablet  Commonly known as:  TAPAZOLE  Take 5 mg by mouth every other day.     potassium chloride SA 20 MEQ tablet  Commonly known as:  K-DUR,KLOR-CON  Take 20 mEq by mouth daily.     ranitidine 150 MG tablet  Commonly known as:  ZANTAC  Take 150 mg by mouth 2 (two) times daily.     traMADol 50 MG tablet  Commonly known as:  ULTRAM  Take 50 mg by mouth every 6 (six) hours as needed for moderate pain.     venlafaxine XR 75 MG 24 hr capsule  Commonly known as:  EFFEXOR-XR  Take 75 mg by mouth daily with  breakfast.     Vitamin D (Ergocalciferol) 50000 UNITS Caps capsule  Commonly known as:  DRISDOL  Take 50,000 Units by mouth every 7 (seven) days. On Friday     warfarin 5 MG tablet  Commonly known as:  COUMADIN  Take 5 mg by mouth daily.        Disposition and follow-up:   PatrickPatrick Drake was discharged from [facility] in Stable condition.  At the hospital follow up visit please address:  1. RUE swelling: Likely 2/2 spontaneous bleed. Restarted warfarin with no lovenox bridge. Discharge INR 1.47. Please assess INR and adjust dosing as needed. Hgb was 7.5 at admission. Improved to 10 at discharge after 2 units PRBC. Please assess Hgb level and assess for resolution of swelling and ecchymosis. Will need PT/OT. Wrist splint. 2. DM (diabetes mellitus) type 2: CBGs low during hospitalization. Discharged on Levemir 12 units qam. CBGs were low in the am and trended up overnight. Would likely benefit from morning dosing of his basal insulin  rather than nightly. Please reassess his blood glucose readings and adjust accordingly.  3. Right Upper Leg Abscess:  Aquacel dressing changes three times a week. Completed course of Clindamycin. Please assess for any signs or symptoms of infection.  4. Wrist Drop: Likely 2/2 edema causing nerve compression. Recommend OT at rehab facility. 5. Labs / imaging needed at time of follow-up: Weekly INRs, H/H in 1 week.  6. Pending labs/ test needing follow-up: None  Follow-up Appointments:   Discharge Instructions: Discharge Instructions    Call MD for:  redness, tenderness, or signs of infection (pain, swelling, redness, odor or green/yellow discharge around incision site)    Complete by:  As directed      Call MD for:  severe uncontrolled pain    Complete by:  As directed      Diet - low sodium heart healthy    Complete by:  As directed      Increase activity slowly    Complete by:  As directed            Consultations:    Procedures Performed:  No results found.   Admission HPI: Patrick Drake is an 81 year old male with a PMH of atrial fibrillation on warfarin, insulin dependent diabetes mellitus type 2, hypertension, TIA, hyperlipidemia and hyperthyroidism who presents to [facility] ED with 1 week history of right arm swelling. Patient reports that over the past week he developed a knot on the back right arm that began as a bruise with progressive swelling. He denies any pain. His right hand became swollen with purple discoloration in the past day. His right arm was noted to be purple, cool to the touch with a weak radial pulse. He had an US done which was read as a brachial artery occulusion and sent to the ED. Upon arrival, vascular duplex scan was read by the ED physician, Dr. Roxanne Mins, as an anechoic area with no evidence of arterial occulusion. Denies any trauma.   Venous and arterial duplex of the right arm in the ED show no evidence of DVT or SVT with no stenosis and normal waveforms  throughout.   Patient was recently hospitalized. Cannot recall the details of the hospitalization. Appears he had an abscess on his right upper thigh that was drained. Wound vac in place. He is on day 10 of clindamycin per nursing facility medication list. He is a resident of [facility] in [place] by problem  list: Principal Problem:   Swelling of right upper extremity Active Problems:   Normochromic normocytic anemia   DM (diabetes mellitus) (HCC)   Atrial fibrillation (HCC)   AKI (acute kidney injury) (HCC)   OSA (obstructive sleep apnea)   Abscess of right leg   Venous bleed   Swelling and Ecchymosis in RUE: Dopplers of his RUE were done and showed no evidence of DVT or SVT with no stenosis and normal waveforms. He had an acute drop in his Hgb from 11.4 at his previous hospitalization (discharged 9/27) at [facility] to 7.5 on admission. He was taken off warfarin during his previous hospitalization for I&D of a leg abscess and bridged with Lovenox. INR was 1.92 at admission. No evidence of an old IV in his right arm. Likely spontaneous bleed into soft tissue of right arm and hand. We held his warfarin and gave him 2 units of packed red blood cells. Hgb improved to 9.6 following transfusion. X-ray of right arm with only non-specific tissue edema. U/S of right arm showed generalized edema without mass or fluid collection. The swelling improved with a stable H/H. Restarted his warfarin with no Lovenox bridge as this may have contributed to the spontaneous bleed. Was only off warfarin for 1 day, no hypercoagulable risk and low weekly risk of stroke in A. Fib. INR 1.47 at discharge.  Wrist split place, will need PT/OT at rehab facility.   Normochromic normocytic anemia: Likely 2/2 to active bleeding in right arm. Recieved 2 units PRBC. H/H was stable after receiving transfusion. Hgb 10.0 at discharge.   DM (diabetes mellitus) type 2: Last A1c was 8.1 in September 2016.  Patient's home regimen is Levemir 50 units bid with glimepridie 4 mg daily. CBGs were low during hospitalization. CBG 55 fasting after receiving Levemir 20 units qhs. Discharged on Levemir 12 units qam and stop other DM medications.   Atrial fibrillation: Patient with chronic A. Fib on warfarin. Warfarin temporarily held at last hospitalization prior to I&D. Restarted warfarin per above.   AKI (acute kidney injury) on CKD: Mildly elevated at 1.46 on admission. Patient's baseline Cr is around 1.3. Improved to 1.29 prior to discharge.   Hyperthyroidism: On methimazole 5 mg daily. TSH within normal limits. Continued home meds.  OSA (obstructive sleep apnea): Continued CPAP qhs which patient refused.   Right Upper Leg Abscess: Was recently hospitalized for right upper leg abscess in his medial thigh. Finished 10 day course of Clindamycin started at [facility] during hospitalization. Wound vac was in place at admission. Wound care was consulted. Depth improved with no further need of wound vac. Aquacel dressings were started. Will need wound changes three times a week at discharge.   Discharge Vitals:   BP 108/72 mmHg  Pulse 75  Temp(Src) 99.3 F (37.4 C) (Oral)  Resp 18  Ht 5\' 11"  (1.803 m)  Wt 187 lb 6.3 oz (85 kg)  BMI 26.15 kg/m2  SpO2 94%  Discharge Labs:  Results for orders placed or performed during the hospital encounter of 06/16/15 (from the past 24 hour(s))  Glucose, capillary     Status: Abnormal   Collection Time: 06/17/15 12:19 PM  Result Value Ref Range   Glucose-Capillary 133 (H) 65 - 99 mg/dL  Glucose, capillary     Status: Abnormal   Collection Time: 06/17/15  4:19 PM  Result Value Ref Range   Glucose-Capillary 189 (H) 65 - 99 mg/dL  Glucose, capillary     Status: Abnormal   Collection  Time: 06/17/15  8:40 PM  Result Value Ref Range   Glucose-Capillary 207 (H) 65 - 99 mg/dL   Comment 1 Notify RN    Comment 2 Document in Chart   Glucose, capillary     Status:  Abnormal   Collection Time: 06/18/15 12:17 AM  Result Value Ref Range   Glucose-Capillary 124 (H) 65 - 99 mg/dL   Comment 1 Notify RN    Comment 2 Document in Chart   Glucose, capillary     Status: None   Collection Time: 06/18/15  4:09 AM  Result Value Ref Range   Glucose-Capillary 91 65 - 99 mg/dL   Comment 1 Notify RN    Comment 2 Document in Chart   CBC     Status: Abnormal   Collection Time: 06/18/15  5:11 AM  Result Value Ref Range   WBC 6.4 4.0 - 10.5 K/uL   RBC 3.01 (L) 4.22 - 5.81 MIL/uL   Hemoglobin 9.7 (L) 13.0 - 17.0 g/dL   HCT 30.0 (L) 39.0 - 52.0 %   MCV 99.7 78.0 - 100.0 fL   MCH 32.2 26.0 - 34.0 pg   MCHC 32.3 30.0 - 36.0 g/dL   RDW 18.2 (H) 11.5 - 15.5 %   Platelets 235 150 - 400 K/uL  Basic metabolic panel     Status: Abnormal   Collection Time: 06/18/15  5:11 AM  Result Value Ref Range   Sodium 135 135 - 145 mmol/L   Potassium 4.3 3.5 - 5.1 mmol/L   Chloride 99 (L) 101 - 111 mmol/L   CO2 29 22 - 32 mmol/L   Glucose, Bld 80 65 - 99 mg/dL   BUN 21 (H) 6 - 20 mg/dL   Creatinine, Ser 1.24 0.61 - 1.24 mg/dL   Calcium 9.1 8.9 - 10.3 mg/dL   GFR calc non Af Amer 52 (L) >60 mL/min   GFR calc Af Amer 60 (L) >60 mL/min   Anion gap 7 5 - 15  Protime-INR     Status: Abnormal   Collection Time: 06/18/15  5:11 AM  Result Value Ref Range   Prothrombin Time 19.5 (H) 11.6 - 15.2 seconds   INR 1.65 (H) 0.00 - 1.49  Glucose, capillary     Status: Abnormal   Collection Time: 06/18/15  7:55 AM  Result Value Ref Range   Glucose-Capillary 55 (L) 65 - 99 mg/dL  Glucose, capillary     Status: Abnormal   Collection Time: 06/18/15  8:37 AM  Result Value Ref Range   Glucose-Capillary 109 (H) 65 - 99 mg/dL    Signed: Maryellen Pile, MD 06/18/2015, 11:44 AM

## 2015-06-17 NOTE — Progress Notes (Signed)
Utilization Review Completed.Shawne Eskelson T10/05/2015  

## 2015-06-17 NOTE — Progress Notes (Addendum)
Subjective: Patient seen and evaluated this morning. Patrick Drake reports that his arm feels better this morning and is able to move it more than before. Denies any pain.   Objective: Vital signs in last 24 hours: Filed Vitals:   06/16/15 2000 06/16/15 2056 06/17/15 0511 06/17/15 0737  BP:  138/58 120/63   Pulse: 72 77 81   Temp:  98 F (36.7 C) 97.6 F (36.4 C)   TempSrc:  Oral Oral   Resp: 18 20 18    Height:      Weight:      SpO2: 97% 96% 95% 97%   Weight change:   Intake/Output Summary (Last 24 hours) at 06/17/15 1235 Last data filed at 06/17/15 0911  Gross per 24 hour  Intake   1433 ml  Output   1720 ml  Net   -287 ml    Physical Exam: General appearance: alert, cooperative, appears stated age and no distress Lungs: clear to auscultation bilaterally, no rales or wheezing Heart: regular rate and rhythm, S1, S2 normal, no murmur, click, rub or gallop Abdomen: soft, non-tender; bowel sounds normal Pulses: 2+ and symmetric Skin: Warm, dry. Right thigh wound bandaged.  Neurologic: Alert and oriented X 3, normal strength and tone. Extremities: Right arm swollen with ecchymosis to shoulder. Non-tender to palpation. Sensation intact. No masses palpated. Right hand warm to touch. normal capillary refill. 1+ radial pulse.   Medications: I have reviewed the patient's current medications. Scheduled Meds: . acidophilus  1 capsule Oral BID  . atenolol  75 mg Oral Daily  . atorvastatin  40 mg Oral q1800  . famotidine  20 mg Oral Daily  . Influenza vac split quadrivalent PF  0.5 mL Intramuscular Tomorrow-1000  . insulin detemir  20 Units Subcutaneous QHS  . methimazole  5 mg Oral Q48H  . mometasone-formoterol  2 puff Inhalation BID  . venlafaxine XR  75 mg Oral Q breakfast   Continuous Infusions:  PRN Meds:.acetaminophen **OR** acetaminophen, albuterol, alum & mag hydroxide-simeth, guaiFENesin-dextromethorphan Assessment/Plan:  Swelling and Ecchymosis in RUE: s/p 2  units PRBC yesterday. Hgb stable at 9.6 this morning from post transfusion 9.3 yesterday. INR 0.77 today. Likely had a spontaneous bleed into the soft tissue of his right arm and hand causing the swelling. No evidence of clots. Will restart his wafarin today. Low weekly risk of stroke in A. Fib, will not bridge with Lovenox as this may have contributed to the spontaneous bleed. Was only off warfarin for 1 day, unlikely to become hypercoagulable without bridge. Possible D/C back to SNF tomorrow.  -Daily CBC -Restart warfarin per pharmacy -Daily INR -Social work for placement  Normochromic normocytic anemia: per above. Likely active bleeding in right arm.   DM (diabetes mellitus) type 2: Last A1c 8.1 in September 2016. Patient's home regimen is Levemir 50 units bid with glimepride 4 mg daily. -Levemir 20units qhs -CBGs qac and qhs   Atrial fibrillation: Patient with chronic A. Fib on warfarin. Warfarin temporarily held at last hospitalization prior to I&D. Held warfarin yesterday, will restart per above.   AKI (acute kidney injury) on CKD: Cr stable at 1.43. Patient's baseline Cr is around 1.3. -Daily BMET -Monitor  Hyperthyroidism: On methimazole 5 mg daily.  -TSH wnl -Continue home meds   OSA (obstructive sleep apnea): CPAP qhs  Right Upper Leg Abscess: Was recently hospitalized for right upper leg abscess in his medial thigh. Wound care consulted. No longer needs wound vac. Can use aquacel wet to dry dressings.  -  One dose of nystatin for right inguinal crease fungal growth  FEN: Carb modified diet  DVT PPx: SCDs  CODE: DNR/DNI  Dispo: Disposition is deferred at this time, awaiting improvement of current medical problems.  Anticipated discharge in approximately 1-2 day(s).   The patient does not have a current PCP (No primary care provider on file.) and does need an [facility] hospital follow-up appointment after discharge.  The patient does have transportation limitations that  hinder transportation to clinic appointments.  .Services Needed at time of discharge: Y = Yes, Blank = No PT:   OT:   RN:   Equipment:   Other:     LOS: 1 day   Maryellen Pile, MD IMTS PGY-1 [phone] 06/17/2015, 12:35 PM

## 2015-06-17 NOTE — Progress Notes (Signed)
Pt refuses to wear CPAP tonight. No distress noted at this time. Pt encouraged to call RT if pt changes mind.

## 2015-06-17 NOTE — Progress Notes (Signed)
ANTICOAGULATION CONSULT NOTE - Initial Consult  Pharmacy Consult for warfarin Indication: atrial fibrillation  No Known Allergies  Patient Measurements: Height: 5\' 11"  (180.3 cm) Weight: 187 lb 6.3 oz (85 kg) IBW/kg (Calculated) : 75.3 Heparin Dosing Weight:   Vital Signs: Temp: 97.6 F (36.4 C) (10/09 0511) Temp Source: Oral (10/09 0511) BP: 120/63 mmHg (10/09 0511) Pulse Rate: 81 (10/09 0511)  Labs:  Recent Labs  06/16/15 0650 06/16/15 2135 06/17/15 0536  HGB 7.5* 9.3* 9.6*  HCT 23.5* 29.2* 29.1*  PLT 265 252 227  LABPROT 21.9*  --  11.1*  INR 1.92*  --  0.77  CREATININE 1.46*  --  1.43*    Estimated Creatinine Clearance: 41 mL/min (by C-G formula based on Cr of 1.43).   Medical History: Past Medical History  Diagnosis Date  . Diabetes mellitus without complication ([place])   . TIA (transient ischemic attack)   . MI (mitral incompetence)   . MRSA (methicillin resistant staph aureus) culture positive   . Depression   . Atrial fibrillation, chronic ([place])   . Pacemaker   . Hyperlipemia   . BPH (benign prostatic hyperplasia)   . Prostate CA [facility])     s/p radiation 2009  . GERD (gastroesophageal reflux disease)   . Hyperthyroidism   . OSA (obstructive sleep apnea)   . Vitamin D deficiency   . Asthma     Medications:  Scheduled:  . acidophilus  1 capsule Oral BID  . atenolol  75 mg Oral Daily  . atorvastatin  40 mg Oral q1800  . famotidine  20 mg Oral Daily  . Influenza vac split quadrivalent PF  0.5 mL Intramuscular Tomorrow-1000  . insulin detemir  20 Units Subcutaneous QHS  . methimazole  5 mg Oral Q48H  . mometasone-formoterol  2 puff Inhalation BID  . venlafaxine XR  75 mg Oral Q breakfast    Assessment: Warfarin PTA for afib, had been on Lovenox bridge after discharge 9/27 - warfarin was held d/t spontaneous bleed into soft tissue of R arm/hand, 2 units PRBC given. INR today 0.77. H/H stable. No evidence of clots. Will not bridge with lovenox as  it may have contributed to spontaneous bleed.   Warfarin PTA dose of 5 mg daily  Goal of Therapy:  INR 2-3 Monitor platelets by anticoagulation protocol: Yes   Plan:  Warfarin 5 mg *1 Daily INR Q72 hour CBC Monitor signs and symptoms of bleeding    Melburn Popper, PharmD Clinical Pharmacy Resident Pager: [phone] 06/17/2015 1:37 PM

## 2015-06-18 ENCOUNTER — Inpatient Hospital Stay (HOSPITAL_COMMUNITY): Payer: Commercial Managed Care - HMO

## 2015-06-18 DIAGNOSIS — Z7901 Long term (current) use of anticoagulants: Secondary | ICD-10-CM | POA: Insufficient documentation

## 2015-06-18 DIAGNOSIS — M21331 Wrist drop, right wrist: Secondary | ICD-10-CM

## 2015-06-18 LAB — CBC
HCT: 30 % — ABNORMAL LOW (ref 39.0–52.0)
Hemoglobin: 9.7 g/dL — ABNORMAL LOW (ref 13.0–17.0)
MCH: 32.2 pg (ref 26.0–34.0)
MCHC: 32.3 g/dL (ref 30.0–36.0)
MCV: 99.7 fL (ref 78.0–100.0)
PLATELETS: 235 10*3/uL (ref 150–400)
RBC: 3.01 MIL/uL — ABNORMAL LOW (ref 4.22–5.81)
RDW: 18.2 % — AB (ref 11.5–15.5)
WBC: 6.4 10*3/uL (ref 4.0–10.5)

## 2015-06-18 LAB — BASIC METABOLIC PANEL
ANION GAP: 7 (ref 5–15)
BUN: 21 mg/dL — ABNORMAL HIGH (ref 6–20)
CALCIUM: 9.1 mg/dL (ref 8.9–10.3)
CO2: 29 mmol/L (ref 22–32)
Chloride: 99 mmol/L — ABNORMAL LOW (ref 101–111)
Creatinine, Ser: 1.24 mg/dL (ref 0.61–1.24)
GFR, EST AFRICAN AMERICAN: 60 mL/min — AB (ref >60)
GFR, EST NON AFRICAN AMERICAN: 52 mL/min — AB (ref >60)
Glucose, Bld: 80 mg/dL (ref 65–99)
Potassium: 4.3 mmol/L (ref 3.5–5.1)
Sodium: 135 mmol/L (ref 135–145)

## 2015-06-18 LAB — GLUCOSE, CAPILLARY
GLUCOSE-CAPILLARY: 109 mg/dL — AB (ref 65–99)
GLUCOSE-CAPILLARY: 115 mg/dL — AB (ref 65–99)
GLUCOSE-CAPILLARY: 124 mg/dL — AB (ref 65–99)
GLUCOSE-CAPILLARY: 208 mg/dL — AB (ref 65–99)
Glucose-Capillary: 91 mg/dL (ref 65–99)
Glucose-Capillary: 55 mg/dL — ABNORMAL LOW (ref 65–99)
Glucose-Capillary: 143 mg/dL — ABNORMAL HIGH (ref 65–99)

## 2015-06-18 LAB — PROTIME-INR
INR: 1.65 — AB (ref 0.00–1.49)
Prothrombin Time: 19.5 seconds — ABNORMAL HIGH (ref 11.6–15.2)

## 2015-06-18 LAB — HEMOGLOBIN A1C
HEMOGLOBIN A1C: 6.7 % — AB (ref 4.8–5.6)
MEAN PLASMA GLUCOSE: 146 mg/dL

## 2015-06-18 MED ORDER — INSULIN DETEMIR 100 UNIT/ML ~~LOC~~ SOLN
15.0000 [IU] | Freq: Every day | SUBCUTANEOUS | Status: DC
  Administered 2015-06-18: 15 [IU] via SUBCUTANEOUS
  Filled 2015-06-18 (×2): qty 0.15

## 2015-06-18 MED ORDER — WARFARIN SODIUM 5 MG PO TABS
5.0000 mg | ORAL_TABLET | Freq: Once | ORAL | Status: AC
  Administered 2015-06-18: 5 mg via ORAL
  Filled 2015-06-18: qty 1

## 2015-06-18 MED ORDER — INSULIN DETEMIR 100 UNIT/ML ~~LOC~~ SOLN: 15.0000 [IU] | Freq: Every day | SUBCUTANEOUS | Status: DC

## 2015-06-18 MED ORDER — ASPIRIN 81 MG PO TBEC: 81.0000 mg | DELAYED_RELEASE_TABLET | ORAL | Status: AC | PRN

## 2015-06-18 NOTE — Progress Notes (Signed)
ANTICOAGULATION CONSULT NOTE - Initial Consult  Pharmacy Consult for warfarin Indication: atrial fibrillation  No Known Allergies  Patient Measurements: Height: 5\' 11"  (180.3 cm) Weight: 187 lb 6.3 oz (85 kg) IBW/kg (Calculated) : 75.3  Vital Signs: Temp: 99.3 F (37.4 C) (10/10 0602) Temp Source: Oral (10/10 0602) BP: 145/87 mmHg (10/10 0602) Pulse Rate: 81 (10/10 0602)  Labs:  Recent Labs  06/16/15 0650 06/16/15 2135 06/17/15 0536 06/18/15 0511  HGB 7.5* 9.3* 9.6* 9.7*  HCT 23.5* 29.2* 29.1* 30.0*  PLT 265 252 227 235  LABPROT 21.9*  --  11.1* 19.5*  INR 1.92*  --  0.77 1.65*  CREATININE 1.46*  --  1.43* 1.24    Estimated Creatinine Clearance: 47.2 mL/min (by C-G formula based on Cr of 1.24).   Medical History: Past Medical History  Diagnosis Date  . Diabetes mellitus without complication ([place])   . TIA (transient ischemic attack)   . MI (mitral incompetence)   . MRSA (methicillin resistant staph aureus) culture positive   . Depression   . Atrial fibrillation, chronic ([place])   . Pacemaker   . Hyperlipemia   . BPH (benign prostatic hyperplasia)   . Prostate CA [facility])     s/p radiation 2009  . GERD (gastroesophageal reflux disease)   . Hyperthyroidism   . OSA (obstructive sleep apnea)   . Vitamin D deficiency   . Asthma     Medications:  Scheduled:  . acidophilus  1 capsule Oral BID  . atenolol  75 mg Oral Daily  . atorvastatin  40 mg Oral q1800  . famotidine  20 mg Oral Daily  . Influenza vac split quadrivalent PF  0.5 mL Intramuscular Tomorrow-1000  . insulin detemir  20 Units Subcutaneous QHS  . methimazole  5 mg Oral Q48H  . mometasone-formoterol  2 puff Inhalation BID  . venlafaxine XR  75 mg Oral Q breakfast  . Warfarin - Pharmacist Dosing Inpatient   Does not apply q1800    Assessment: Coumadin 5mg  daily PTA for afib, had been on Lovenox bridge after discharge on 9/27. INR was 1.92 on admit. Warfarin was held d/t spontaneous bleed into  soft tissue of R arm/hand, 2 units PRBC given. INR today at 1.65 after restarting PTA dose yesterday. No evidence of clots. Hgb stable at 9.7, plts wnl. No s/s of bleed  Goal of Therapy:  INR 2-3 Monitor platelets by anticoagulation protocol: Yes   Plan:  Give coumadin 5mg  PO x 1 Monitor daily INR, CBC, s/s of bleed   Elenor Quinones, PharmD Clinical Pharmacist Pager [phone] 06/18/2015 8:37 AM

## 2015-06-18 NOTE — Progress Notes (Signed)
[place] on call notified that [place] needed to have the arm marked in order to do procedure will clarify this in am. Verdene Lennert Helaina Stefano LPN

## 2015-06-18 NOTE — Progress Notes (Signed)
Subjective: Patient seen and evaluated this morning. He denies any pain or tenderness. Swelling improving. Range of motion and grip strength improving though still limited. He was a little confused and sluggish this morning. Neuro exam normal. CBG was 55. Improved with carbohydrate snack.   Objective: Vital signs in last 24 hours: Filed Vitals:   06/17/15 2148 06/18/15 0602 06/18/15 1007 06/18/15 1040  BP: 118/63 145/87  108/72  Pulse: 62 81  75  Temp: 97.7 F (36.5 C) 99.3 F (37.4 C)    TempSrc: Oral Oral    Resp: 16 18    Height:      Weight:      SpO2: 95% 92% 94%    Weight change:   Intake/Output Summary (Last 24 hours) at 06/18/15 1123 Last data filed at 06/18/15 0915  Gross per 24 hour  Intake    440 ml  Output    400 ml  Net     40 ml    Physical Exam: General appearance: alert, cooperative, appears stated age and no distress Lungs: clear to auscultation bilaterally, no rales or wheezing Heart: regular rate and rhythm, S1, S2 normal, no murmur, click, rub or gallop Abdomen: soft, non-tender; bowel sounds normal Pulses: 2+ and symmetric Skin: Warm, dry. Right thigh wound bandaged.  Neurologic: Alert and oriented X 3. 5/5 strength in left UE and bilateral LEs. Strength and ROM limited in RUE 2/2 swelling. No focal deficits. CN II-XII intact. Sensation grossly intact.  Extremities: Right arm swollen with ecchymosis to shoulder. Non-tender to palpation. Sensation intact. No masses palpated. Right hand warm to touch. Normal capillary refill. 2+ radial pulse. +Wrist Drop  Medications: I have reviewed the patient's current medications. Scheduled Meds: . acidophilus  1 capsule Oral BID  . atenolol  75 mg Oral Daily  . atorvastatin  40 mg Oral q1800  . famotidine  20 mg Oral Daily  . Influenza vac split quadrivalent PF  0.5 mL Intramuscular Tomorrow-1000  . insulin detemir  20 Units Subcutaneous QHS  . methimazole  5 mg Oral Q48H  . mometasone-formoterol  2 puff  Inhalation BID  . venlafaxine XR  75 mg Oral Q breakfast  . warfarin  5 mg Oral ONCE-1800  . Warfarin - Pharmacist Dosing Inpatient   Does not apply q1800   Continuous Infusions:  PRN Meds:.acetaminophen **OR** acetaminophen, albuterol, alum & mag hydroxide-simeth, guaiFENesin-dextromethorphan Assessment/Plan:  Swelling and Ecchymosis in RUE: s/p 2 units PRBC. Hgb stable at 9.7 this morning from post transfusion 9.3. INR 1.65 today after restarting warfarin yesterday. Having wrist drop. Possibly 2/2 median nerve compression. Will get imaging for further evaluation. Has a pacemaker. -warfarin per pharmacy -Daily INR -[place] RUE -XRAY RUE  Normochromic normocytic anemia: per above. Likely active bleeding in right arm.   DM (diabetes mellitus) type 2: CBG 55 this morning. Last A1c 8.1 in September 2016. Patient's home regimen is Levemir 50 units bid with glimepride 4 mg daily. -Will decrease Levemir to 15 units qhs -CBGs qac and qhs   Atrial fibrillation: Patient with chronic A. Fib on warfarin. Warfarin temporarily held at last hospitalization prior to I&D. Warfarin per above.   AKI (acute kidney injury) on CKD: Resolved. Cr 1.24. Patient's baseline Cr is around 1.3. -Daily BMET -Monitor  Hyperthyroidism: On methimazole 5 mg daily.  -TSH wnl -Continue home meds   OSA (obstructive sleep apnea): CPAP qhs. Patient refusing CPAP.   Right Upper Leg Abscess: Was recently hospitalized for right upper leg abscess in his  medial thigh. Wound care consulted. No longer needs wound vac. Can use aquacel wet to dry dressings. No signs/symptoms of infection at this time.   FEN: Carb modified diet  DVT PPx: SCDs   CODE: DNR/DNI  Dispo: Disposition is deferred at this time, awaiting improvement of current medical problems.  Anticipated discharge in approximately 1-2 day(s).   The patient does not have a current PCP (No primary care provider on file.) and does need an [facility] hospital follow-up  appointment after discharge.  The patient does have transportation limitations that hinder transportation to clinic appointments.  .Services Needed at time of discharge: Y = Yes, Blank = No PT:   OT:   RN:   Equipment:   Other:     LOS: 2 days   Maryellen Pile, MD IMTS PGY-1 [phone] 06/18/2015, 11:23 AM

## 2015-06-18 NOTE — Care Management Note (Signed)
Case Management Note  Patient Details  Name: Patrick Drake MRN: [id number] Date of Birth: September 04, 1929  Subjective/Objective:                 Patient admitted from [facility] for swelling of extremity.   Action/Plan:  Anticipate dc to SNF today, SW updated.   Expected Discharge Date:  06/19/15               Expected Discharge Plan:  Skilled Nursing Facility  In-House Referral:  Clinical Social Work  Discharge planning Services  CM Consult  Post Acute Care Choice:    Choice offered to:     DME Arranged:    DME Agency:     HH Arranged:    [place] Agency:     Status of Service:  Completed, signed off  Medicare Important Message Given:    Date Medicare IM Given:    Medicare IM give by:    Date Additional Medicare IM Given:    Additional Medicare Important Message give by:     If discussed at [place] of Stay Meetings, dates discussed:    Additional Comments:  Carles Collet, RN 06/18/2015, 10:44 AM

## 2015-06-18 NOTE — Progress Notes (Signed)
Hypoglycemic Event  CBG: 55  Treatment: 15 GM carbohydrate snack  Symptoms: None  Follow-up CBG: Time: 0837 CBG Result: 109  Possible Reasons for Event: Unknown  Comments/MD notified: None    Kandee Keen

## 2015-06-18 NOTE — Progress Notes (Signed)
  Date: 06/18/2015  Patient name: Patrick Drake  Medical record number: [id number]  Date of birth: 12-23-1928   This patient's plan of care was discussed with the house staff. Please see Dr. Shanna Cisco note for complete details. I concur with his findings.  Patrick Drake is doing better from his blood loss and swelling in his arm, however, he now has a new (likely related to the swelling) wrist drop on the right.  Would prefer to get an MRI to evaluated for soft tissue swelling, etc, however, he has a pacemaker.  Will plan to get ultrasound of the limb and xrays of the shoulder, elbow, wrist to assess for any mechanical disorder.  If no obvious cause, will get OT to evaluate for splinting.     Sid Falcon, MD 06/18/2015, 5:19 PM

## 2015-06-18 NOTE — Progress Notes (Signed)
Orthopedic Tech Progress Note Patient Details:  Patrick Drake 08-Apr-1929 [id number]  Ortho Devices Type of Ortho Device: Velcro wrist splint Ortho Device/Splint Location: RUE Ortho Device/Splint Interventions: Ordered, Application   Braulio Bosch 06/18/2015, 8:19 PM

## 2015-06-19 ENCOUNTER — Inpatient Hospital Stay (HOSPITAL_COMMUNITY): Payer: Commercial Managed Care - HMO

## 2015-06-19 DIAGNOSIS — M7989 Other specified soft tissue disorders: Secondary | ICD-10-CM | POA: Diagnosis not present

## 2015-06-19 LAB — GLUCOSE, CAPILLARY
GLUCOSE-CAPILLARY: 64 mg/dL — AB (ref 65–99)
Glucose-Capillary: 79 mg/dL (ref 65–99)
Glucose-Capillary: 109 mg/dL — ABNORMAL HIGH (ref 65–99)
Glucose-Capillary: 184 mg/dL — ABNORMAL HIGH (ref 65–99)

## 2015-06-19 LAB — CBC
HCT: 31.7 % — ABNORMAL LOW (ref 39.0–52.0)
Hemoglobin: 10 g/dL — ABNORMAL LOW (ref 13.0–17.0)
MCH: 31.6 pg (ref 26.0–34.0)
MCHC: 31.5 g/dL (ref 30.0–36.0)
MCV: 100.3 fL — ABNORMAL HIGH (ref 78.0–100.0)
PLATELETS: 231 10*3/uL (ref 150–400)
RBC: 3.16 MIL/uL — ABNORMAL LOW (ref 4.22–5.81)
RDW: 18 % — ABNORMAL HIGH (ref 11.5–15.5)
WBC: 5.2 10*3/uL (ref 4.0–10.5)

## 2015-06-19 LAB — BASIC METABOLIC PANEL
Anion gap: 9 (ref 5–15)
BUN: 22 mg/dL — AB (ref 6–20)
CO2: 29 mmol/L (ref 22–32)
CREATININE: 1.29 mg/dL — AB (ref 0.61–1.24)
Calcium: 9.5 mg/dL (ref 8.9–10.3)
Chloride: 99 mmol/L — ABNORMAL LOW (ref 101–111)
GFR calc Af Amer: 57 mL/min — ABNORMAL LOW (ref >60)
GFR, EST NON AFRICAN AMERICAN: 49 mL/min — AB (ref >60)
Glucose, Bld: 78 mg/dL (ref 65–99)
Potassium: 4.2 mmol/L (ref 3.5–5.1)
SODIUM: 137 mmol/L (ref 135–145)

## 2015-06-19 LAB — PROTIME-INR
INR: 1.47 (ref 0.00–1.49)
PROTHROMBIN TIME: 17.9 s — AB (ref 11.6–15.2)

## 2015-06-19 MED ORDER — INSULIN DETEMIR 100 UNIT/ML ~~LOC~~ SOLN
12.0000 [IU] | Freq: Every day | SUBCUTANEOUS | Status: DC
  Filled 2015-06-19: qty 0.12

## 2015-06-19 MED ORDER — INSULIN DETEMIR 100 UNIT/ML ~~LOC~~ SOLN: 12.0000 [IU] | Freq: Every morning | SUBCUTANEOUS | Status: AC

## 2015-06-19 MED ORDER — WARFARIN SODIUM 7.5 MG PO TABS
7.5000 mg | ORAL_TABLET | Freq: Once | ORAL | Status: AC
  Administered 2015-06-19: 7.5 mg via ORAL
  Filled 2015-06-19: qty 1

## 2015-06-19 NOTE — Evaluation (Signed)
Physical Therapy Evaluation Patient Details Name: Patrick Drake MRN: [id number] DOB: 1928-12-27 Today's Date: 06/19/2015   History of Present Illness  Patient is an 81y/o male admitted with right UE hematoma due to anticoagulation with a-fib and recent MSSA abcess on right thigh with wound vac.  Clinical Impression  Patient presents with decreased independence with mobility due to deficits listed in PT problem list to allow return home following continued rehab at SNF level of care.    Follow Up Recommendations SNF    Equipment Recommendations  None recommended by PT    Recommendations for Other Services       Precautions / Restrictions Precautions Precautions: Fall Required Braces or Orthoses: Other Brace/Splint Other Brace/Splint: R wrist splint      Mobility  Bed Mobility Overal bed mobility: Needs Assistance       Supine to sit: Max assist Sit to supine: Mod assist   General bed mobility comments: assist for lifting trunk and scooting to edge of bed; to supine assist with LE's in bed and with repositioning trunk after supine  Transfers Overall transfer level: Needs assistance Equipment used: Rolling walker (2 wheeled) Transfers: Sit to/from Stand Sit to Stand: Mod assist         General transfer comment: lifting assist from seat with cues for hand placement  Ambulation/Gait Ambulation/Gait assistance: Min assist;Mod assist Ambulation Distance (Feet): 60 Feet Assistive device: Rolling walker (2 wheeled) Gait Pattern/deviations: Step-through pattern;Decreased stride length;Shuffle     General Gait Details: decreased stride length and some mild festinating as fatiguing with increased distance with ambulation  Stairs            Wheelchair Mobility    Modified Rankin (Stroke Patients Only)       Balance           Standing balance support: Bilateral upper extremity supported Standing balance-Leahy Scale: Poor Standing balance  comment: RW needed for balance and min assist                             Pertinent Vitals/Pain Pain Assessment: No/denies pain    Home Living Family/patient expects to be discharged to:: Skilled nursing facility                      Prior Function Level of Independence: Needs assistance   Gait / Transfers Assistance Needed: Pt reports, "I needed help to walk. I was able to get to bathroom."  ADL's / Homemaking Assistance Needed: Pt reports, "They helped me at the nursing home."        Hand Dominance   Dominant Hand: Right    Extremity/Trunk Assessment   Upper Extremity Assessment: Defer to OT evaluation           Lower Extremity Assessment: Generalized weakness         Communication   Communication: HOH  Cognition Arousal/Alertness: Awake/alert Behavior During Therapy: WFL for tasks assessed/performed Overall Cognitive Status: No family/caregiver present to determine baseline cognitive functioning (not oriented to time/place)                      General Comments      Exercises        Assessment/Plan    PT Assessment Patient needs continued PT services  PT Diagnosis Abnormality of gait;Generalized weakness   PT Problem List Decreased strength;Decreased activity tolerance;Decreased balance;Decreased mobility;Decreased knowledge of use of DME;Decreased safety awareness  PT Treatment Interventions DME instruction;Balance training;Gait training;Functional mobility training;Patient/family education;Therapeutic activities;Therapeutic exercise   PT Goals (Current goals can be found in the Care Plan section) Acute Rehab PT Goals Patient Stated Goal: to work on getting better PT Goal Formulation: With patient Time For Goal Achievement: 07/03/15 Potential to Achieve Goals: Good    Frequency Min 2X/week   Barriers to discharge        Co-evaluation               End of Session Equipment Utilized During Treatment: Gait  belt Activity Tolerance: Patient tolerated treatment well Patient left: in bed;with call bell/phone within reach;with bed alarm set           Time: 7673-4193 PT Time Calculation (min) (ACUTE ONLY): 21 min   Charges:   PT Evaluation $Initial PT Evaluation Tier I: 1 Procedure     PT G Codes:        Gianella Chismar,CYNDI 2015/06/30, 4:01 PM  Magda Kiel, [place] June 30, 2015

## 2015-06-19 NOTE — Progress Notes (Addendum)
ANTICOAGULATION CONSULT NOTE - Initial Consult  Pharmacy Consult for warfarin Indication: atrial fibrillation  No Known Allergies  Patient Measurements: Height: 5\' 11"  (180.3 cm) Weight: 187 lb 6.3 oz (85 kg) IBW/kg (Calculated) : 75.3  Vital Signs: Temp: 98 F (36.7 C) (10/11 0543) Temp Source: Oral (10/11 0543) BP: 145/77 mmHg (10/11 0543) Pulse Rate: 72 (10/11 0543)  Labs:  Recent Labs  06/17/15 0536 06/18/15 0511 06/19/15 0717 06/19/15 0738  HGB 9.6* 9.7*  --  10.0*  HCT 29.1* 30.0*  --  31.7*  PLT 227 235  --  231  LABPROT 11.1* 19.5* 17.9*  --   INR 0.77 1.65* 1.47  --   CREATININE 1.43* 1.24  --   --     Estimated Creatinine Clearance: 47.2 mL/min (by C-G formula based on Cr of 1.24).   Medical History: Past Medical History  Diagnosis Date  . Diabetes mellitus without complication ([place])   . TIA (transient ischemic attack)   . MI (mitral incompetence)   . MRSA (methicillin resistant staph aureus) culture positive   . Depression   . Atrial fibrillation, chronic ([place])   . Pacemaker   . Hyperlipemia   . BPH (benign prostatic hyperplasia)   . Prostate CA [facility])     s/p radiation 2009  . GERD (gastroesophageal reflux disease)   . Hyperthyroidism   . OSA (obstructive sleep apnea)   . Vitamin D deficiency   . Asthma     Medications:  Scheduled:  . acidophilus  1 capsule Oral BID  . atenolol  75 mg Oral Daily  . atorvastatin  40 mg Oral q1800  . famotidine  20 mg Oral Daily  . Influenza vac split quadrivalent PF  0.5 mL Intramuscular Tomorrow-1000  . insulin detemir  15 Units Subcutaneous QHS  . methimazole  5 mg Oral Q48H  . mometasone-formoterol  2 puff Inhalation BID  . venlafaxine XR  75 mg Oral Q breakfast  . Warfarin - Pharmacist Dosing Inpatient   Does not apply q1800    Assessment: Coumadin 5mg  daily PTA for afib, had been on Lovenox bridge after discharge on 9/27. INR was 1.92 on admit. Warfarin was held d/t spontaneous bleed into soft  tissue of R arm/hand, 2 units PRBC given. INR today is down to 1.47 after restarting PTA dose 2 days ago. Hgb stable at 10, plts wnl. No s/s of bleed. No evidence of clots.  Goal of Therapy:  INR 2-3 Monitor platelets by anticoagulation protocol: Yes   Plan:  Give coumadin 7.5mg  PO x 1 Monitor daily INR, CBC, s/s of bleed   Elenor Quinones, PharmD Clinical Pharmacist Pager [phone] 06/19/2015 8:40 AM

## 2015-06-19 NOTE — Evaluation (Signed)
Occupational Therapy Evaluation Patient Details Name: Patrick Drake MRN: [id number] DOB: February 19, 1929 Today's Date: 06/19/2015    History of Present Illness Pt is 81 y/o male who has been on chronic warfarin for atrial fibrillation. He was recently admitted to [facility] in September with a right thigh MSSA abscess. His warfarin was held and he was placed on Lovenox. He had a VAC wound dressing placed and his antibiotics were transitioned to oral clindamycin which she will be completing soon. Lovenox was continued after discharge on 06/05/2015 until his INR became therapeutic again. At some point he began to develop some bruising and swelling of his right arm and hand leading to admission here today. His INR is 1.92. His hemoglobin has dropped from 11.4 when he was in the hospital to 7.5. He has no evidence of venous or arterial clots. He has no evidence of compartment syndrome. I do not see any evidence that he had had an IV in his right arm. MD suspects that he has had a spontaneous bleed into the soft tissue of his right arm and hand.    Clinical Impression   Patient presenting with edema, deconditioned, decreased I in self care, and decreased safety. Patient required assistance PTA and reports that he was receiving OT and PT at SNF . Patient currently functioning set up A - max A. Patient will benefit from acute OT to increase overall independence in the areas of ADLs, functional mobility, safety in order to safely discharge to SNF. Splint appears to fit properly. Pt requiring education to don and doff splint this session. OT educated pt on R hand exercises with written handout. Pt returned demonstrations with min verbal cues for proper technique.    Follow Up Recommendations  SNF    Equipment Recommendations  Other (comment) (defer to next venue)    Recommendations for Other Services PT consult     Precautions / Restrictions Precautions Precautions: Fall Required Braces  or Orthoses: Other Brace/Splint Other Brace/Splint: R wrist splint Restrictions Weight Bearing Restrictions: No      Mobility Bed Mobility Overal bed mobility: Needs Assistance Bed Mobility: Rolling;Supine to Sit;Sit to Supine Rolling: Min guard   Supine to sit: Min assist Sit to supine: Max assist      Transfers Overall transfer level: Needs assistance Equipment used: Rolling walker (2 wheeled) Transfers: Sit to/from Stand Sit to Stand: Max assist         General transfer comment: physical lifting assistn and verbal cues for hand placement and weight shift    Balance Overall balance assessment: Needs assistance Sitting-balance support: Single extremity supported Sitting balance-Leahy Scale: Fair     Standing balance support: Bilateral upper extremity supported Standing balance-Leahy Scale: Poor Standing balance comment: reliant on RW and min A for balance from therapist                            ADL Overall ADL's : Needs assistance/impaired     Grooming: Wash/dry hands;Wash/dry face;Oral care;Set up;Sitting   Upper Body Bathing: Set up;Sitting   Lower Body Bathing: Moderate assistance;Sitting/lateral leans   Upper Body Dressing : Set up   Lower Body Dressing: Sit to/from stand;Maximal assistance                 General ADL Comments: During assessment, pt standing from standard bed with max A and taking 3 steps with min A for balance. Pt becoming incontinent of bowel and bladder requiring total  A for hygiene and clothing management from bed. Pt requiring total A for sit >supinr as he is very upset from having accident on floor.                Pertinent Vitals/Pain Pain Assessment: No/denies pain     Hand Dominance Right   Extremity/Trunk Assessment Upper Extremity Assessment Upper Extremity Assessment: RUE deficits/detail RUE Deficits / Details: Pt does not report any sensation loss in R hand. Pt able to bring wrist to neutral  from flexed position. He is unable to extend wrist with AROM. Minimal edema noted this evaluation.   Lower Extremity Assessment Lower Extremity Assessment: Generalized weakness       Communication Communication Communication: HOH   Cognition Arousal/Alertness: Awake/alert Behavior During Therapy: WFL for tasks assessed/performed Overall Cognitive Status: No family/caregiver present to determine baseline cognitive functioning (Pt with decreased orientation to time and situation.)       Memory:  (unable to report this therapist name 3 minutes after stating it. )                        Home Living Family/patient expects to be discharged to:: Skilled nursing facility               Prior Functioning/Environment Level of Independence: Needs assistance  Gait / Transfers Assistance Needed: Pt reports, "I needed help to walk. I was able to get to bathroom." ADL's / Homemaking Assistance Needed: Pt reports, "They helped me at the nursing home."        OT Diagnosis: Generalized weakness   OT Problem List: Decreased strength;Increased edema;Decreased range of motion;Decreased cognition;Decreased safety awareness;Impaired balance (sitting and/or standing)   OT Treatment/Interventions: Self-care/ADL training;Therapeutic exercise;Balance training;Therapeutic activities;Energy conservation;Patient/family education    OT Goals(Current goals can be found in the care plan section) Acute Rehab OT Goals Patient Stated Goal: to work on getting better OT Goal Formulation: With patient Time For Goal Achievement: 07/03/15 Potential to Achieve Goals: Fair ADL Goals Pt Will Perform Grooming: with set-up;sitting Pt Will Perform Upper Body Bathing: with modified independence;sitting Pt Will Perform Lower Body Bathing: with min assist;sit to/from stand Pt Will Perform Upper Body Dressing: with modified independence;sitting Pt Will Perform Lower Body Dressing: with min assist;sit to/from  stand Pt Will Transfer to Toilet: with min assist;bedside commode Pt Will Perform Toileting - Clothing Manipulation and hygiene: with min assist;sit to/from stand Additional ADL Goal #1: Pt will don and doff R splint independently in order to demonstrate knowledge of proper application.  Additional ADL Goal #2: Pt will demonstrate R hand exercises with use of paper handout with supervision in order demonstrate knowledge to accurately perform exercise.   OT Frequency: Min 2X/week   Barriers to D/C:    none known at this time          End of Session Equipment Utilized During Treatment: Rolling walker  Activity Tolerance: Patient tolerated treatment well Patient left: in bed;with bed alarm set;Other (comment) (R UE elevated)   Time: 9449-6759 OT Time Calculation (min): 32 min Charges:  OT General Charges $OT Visit: 1 Procedure OT Evaluation $Initial OT Evaluation Tier I: 1 Procedure OT Treatments $Self Care/Home Management : 8-22 mins  Pittman, [place] Reta L , MS, OTR/L  06/19/2015, 9:06 AM

## 2015-06-19 NOTE — Progress Notes (Signed)
Called report to [facility]. Awaiting transportation. Nephew notified of patient placement and will be notified once transportation arrives. Nursing staff will continue to monitor. Earleen Reaper, RN

## 2015-06-19 NOTE — Progress Notes (Signed)
Hypoglycemic Event  CBG: 64  Treatment: 15 GM carbohydrate snack  Symptoms: None  Follow-up CBG: Time: 0904 CBG Result:79  Possible Reasons for Event: Unknown  Comments/MD notified: Patient asymptomatic and ate breakfast.    Earleen Reaper

## 2015-06-19 NOTE — Progress Notes (Signed)
  Date: 06/19/2015  Patient name: Patrick Drake  Medical record number: [id number]  Date of birth: 1929-08-26   This patient's plan of care was discussed with the house staff. Please see Dr. Shanna Cisco note for complete details. I concur with his findings.  Patient doing well.  Acute issues resolved.  He has worked with OT for wrist drop.  No discernible cause found on imaging, likely related to neurological injury from acute swelling.  Follow up further with PCP.  Can be discharged today.    Sid Falcon, MD 06/19/2015, 3:56 PM

## 2015-06-19 NOTE — Care Management Important Message (Signed)
Important Message  Patient Details  Name: Patrick Drake MRN: [id number] Date of Birth: 04-Feb-1929   Medicare Important Message Given:  Yes-second notification given    Nathen May 06/19/2015, 10:55 AM

## 2015-06-19 NOTE — Progress Notes (Signed)
Subjective: Patient seen and evaluated this morning. Reports no pain this morning. No complaints.   Objective: Vital signs in last 24 hours: Filed Vitals:   06/18/15 1040 06/18/15 1354 06/18/15 2050 06/19/15 0543  BP: 108/72 107/57 121/69 145/77  Pulse: 75 82 60 72  Temp:   97.7 F (36.5 C) 98 F (36.7 C)  TempSrc:   Oral Oral  Resp:  18 18 17   Height:      Weight:      SpO2:  96% 94% 94%   Weight change:   Intake/Output Summary (Last 24 hours) at 06/19/15 0550 Last data filed at 06/18/15 1852  Gross per 24 hour  Intake    480 ml  Output   1000 ml  Net   -520 ml    Physical Exam:  General: alert, cooperative, appears stated age and no distress Lungs: clear to auscultation bilaterally, no rales or wheezing Heart: regular rate and rhythm, S1, S2 normal, no murmur, click, rub or gallop Abdomen: soft, non-tender; bowel sounds normal Pulses: 2+ and symmetric Skin: Warm, dry. Right thigh wound bandaged.  Neurologic: Alert and oriented X 3. Extremities: Right arm edema improving. Ecchymosis resolving but still present in dependant areas. Non-tender to palpation. Sensation intact. No masses palpated. Right hand warm to touch. Normal capillary refill. 2+ radial pulse. +Wrist Drop  Medications: I have reviewed the patient's current medications. Scheduled Meds: . acidophilus  1 capsule Oral BID  . atenolol  75 mg Oral Daily  . atorvastatin  40 mg Oral q1800  . famotidine  20 mg Oral Daily  . Influenza vac split quadrivalent PF  0.5 mL Intramuscular Tomorrow-1000  . insulin detemir  15 Units Subcutaneous QHS  . methimazole  5 mg Oral Q48H  . mometasone-formoterol  2 puff Inhalation BID  . venlafaxine XR  75 mg Oral Q breakfast  . Warfarin - Pharmacist Dosing Inpatient   Does not apply q1800   Continuous Infusions:  PRN Meds:.acetaminophen **OR** acetaminophen, albuterol, alum & mag hydroxide-simeth, guaiFENesin-dextromethorphan Assessment/Plan:  Swelling and Ecchymosis  in RUE: Hgb stable at 10. INR 1.47. Having wrist drop. Possibly 2/2 median nerve compression. Xray with no abnormalities. [place] pending.  -warfarin per pharmacy -Daily INR -[place] RUE -Wrist splint -PT/OT   Normochromic normocytic anemia: likely 2/2 to spontaneous bleed in RUE. Stable.   DM (diabetes mellitus) type 2: CBG 64 this morning. CBGs have been low every morning despite decreasing nighttime basal insulin dosing and slowly rising throughout the day.  Last A1c 8.1 in September 2016. Patient's home regimen is Levemir 50 units bid with glimepride 4 mg daily. -Levemir to 12 units qhs -CBGs qac and qhs   Atrial fibrillation: Patient with chronic A. Fib on warfarin. Warfarin temporarily held at last hospitalization prior to I&D. Warfarin per above.   AKI (acute kidney injury) on CKD: Resolved. Cr 1.29. Patient's baseline Cr is around 1.3. -Daily BMET -Monitor  Hyperthyroidism: On methimazole 5 mg daily.  -TSH wnl -Continue home meds   OSA (obstructive sleep apnea): CPAP qhs. Patient refusing CPAP.   Right Upper Leg Abscess: Was recently hospitalized for right upper leg abscess in his medial thigh. Wound care consulted. No longer needs wound vac. Can use aquacel wet to dry dressings. No signs/symptoms of infection at this time.   FEN: Carb modified diet  DVT PPx: SCDs   CODE: DNR/DNI  Dispo: Disposition is deferred at this time, awaiting improvement of current medical problems.  Anticipated discharge in approximately 1-2 day(s).  The patient does not have a current PCP (No primary care Brennon Otterness on file.) and does need an [facility] hospital follow-up appointment after discharge.  The patient does have transportation limitations that hinder transportation to clinic appointments.  .Services Needed at time of discharge: Y = Yes, Blank = No PT:   OT:   RN:   Equipment:   Other:     LOS: 3 days   Maryellen Pile, MD IMTS PGY-1 [phone] 06/19/2015, 5:50 AM

## 2015-06-20 LAB — TYPE AND SCREEN
ABO/RH(D): A NEG
ANTIBODY SCREEN: NEGATIVE
UNIT DIVISION: 0
UNIT DIVISION: 0
Unit division: 0
Unit division: 0

## 2015-07-22 NOTE — Progress Notes (Signed)
CSW notified this morning by RNCM/nursing that patient is a resident of [facility].  Ok per MD for d/c today back to to [facility] for continued care.  Patient's niece- Glenna Fellows notified and pleased with d/c plan.  Patient is alert but confused.  CSW spoke to Admissions at [facility] and bed is available for patient.  [employer] Auth received prior to d/c  [id number].  RN notified to call report to facility.  No further CSW needs identified. CSW signing off. Lorie Phenix. Pauline Good, [place]

## 2016-02-07 DEATH — deceased

## 2016-08-25 IMAGING — US US EXTREM UP *R* LTD
1 series · 14 of 17 positions shown · non-contrast
Comparison: None

CLINICAL DATA: Right elbow and wrist region edema

EXAM:
ULTRASOUND RIGHT UPPER EXTREMITY LIMITED
TECHNIQUE: Ultrasound examination of the upper extremity soft tissues was
performed in the area of clinical concern.

[Series 1: us extrem up *right* ltd · 0.07mm/px · 14 of 17 slices shown]
[im 1/17]
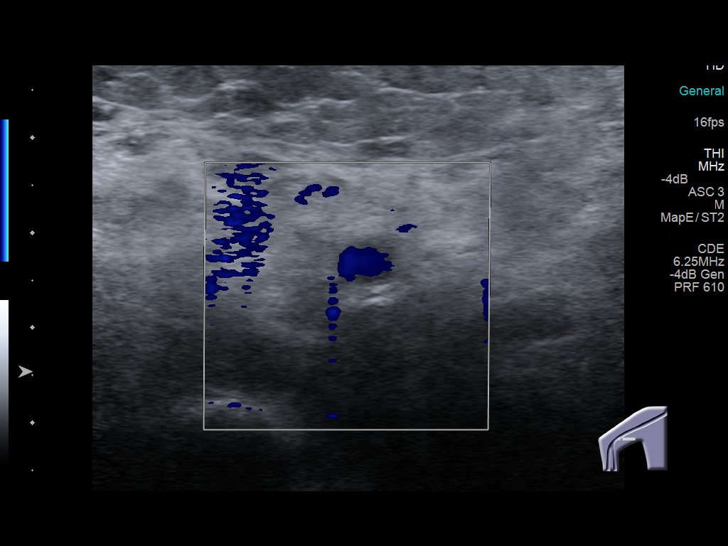
[im 2/17]
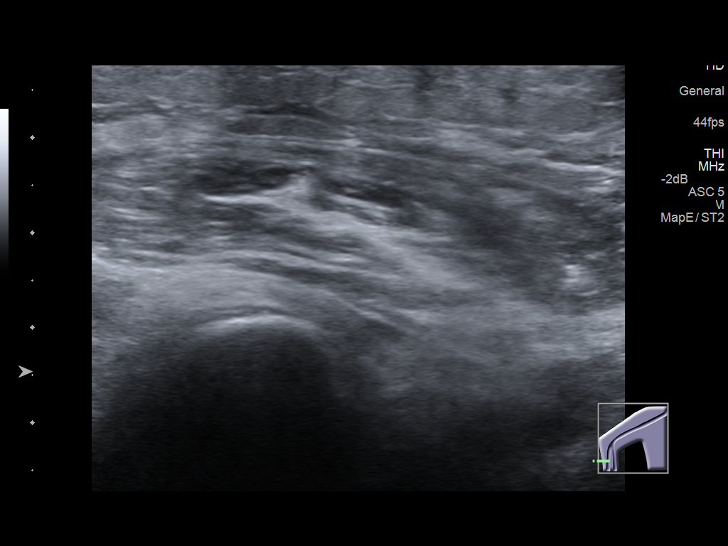
[im 4/17]
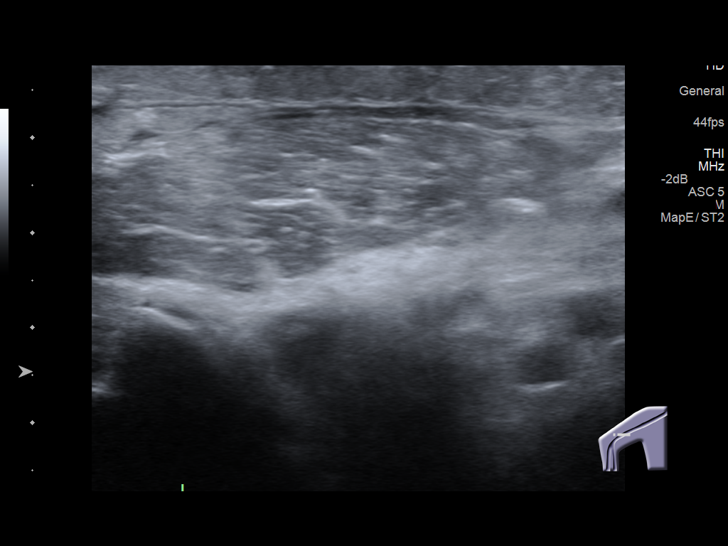
[im 5/17]
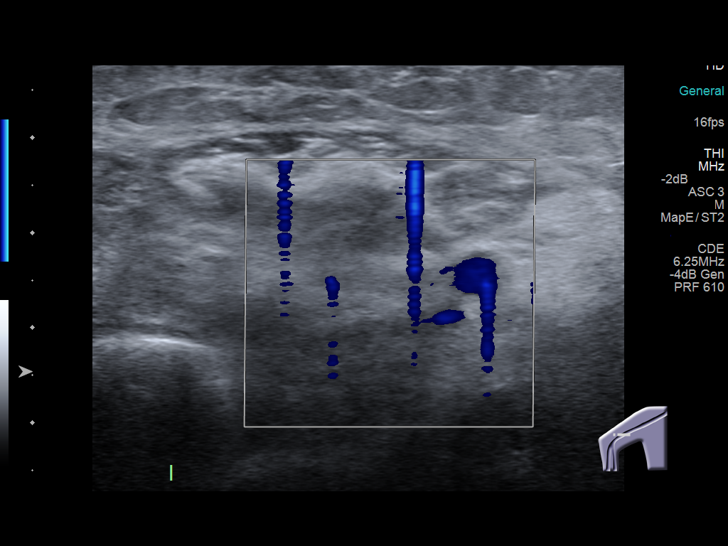
[im 6/17]
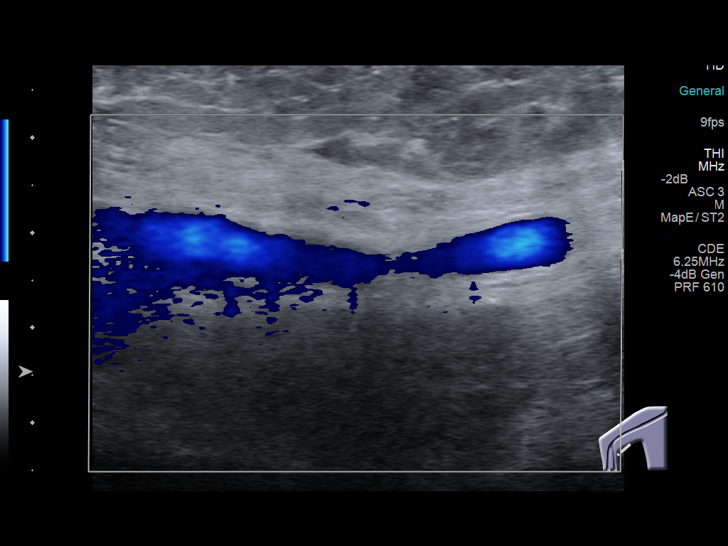
[im 7/17]
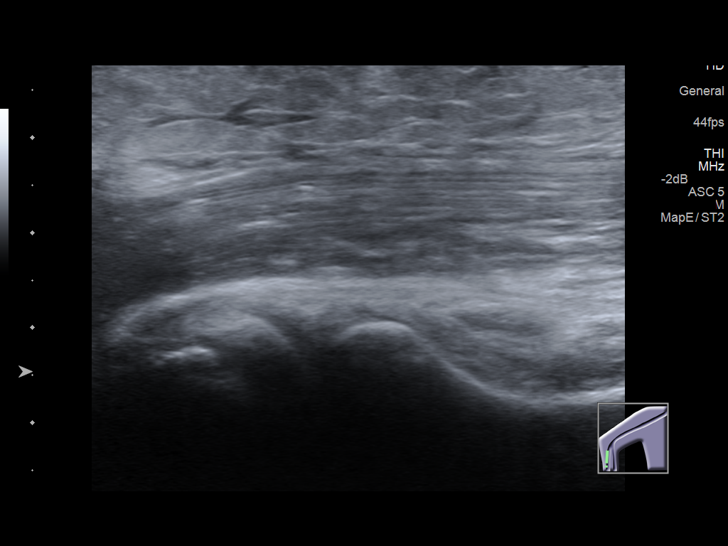
[im 8/17]
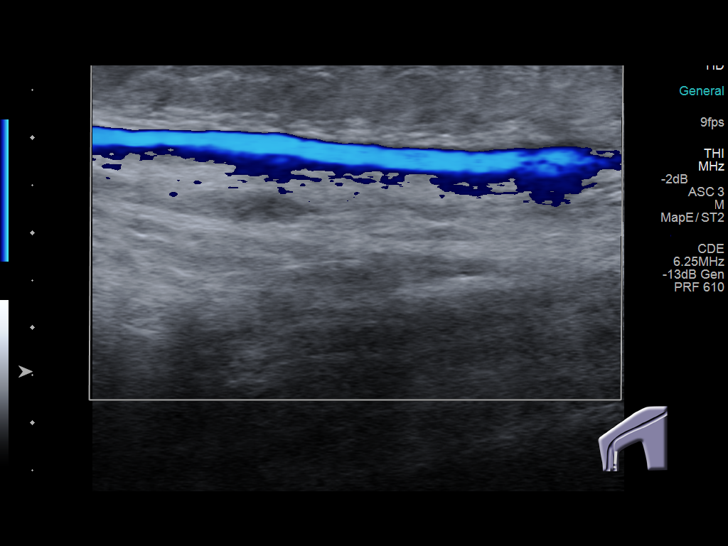
[im 10/17]
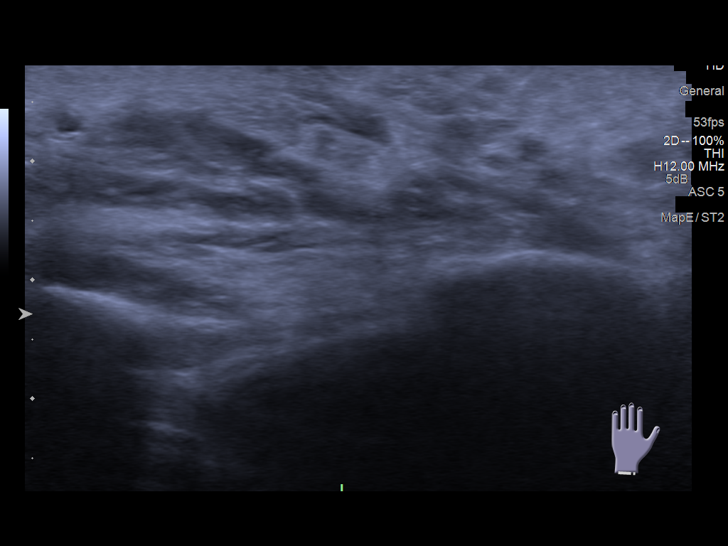
[im 11/17]
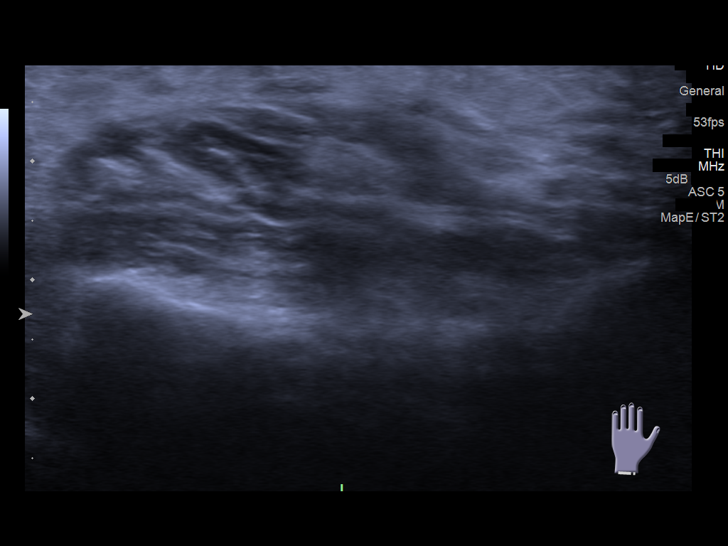
[im 12/17]
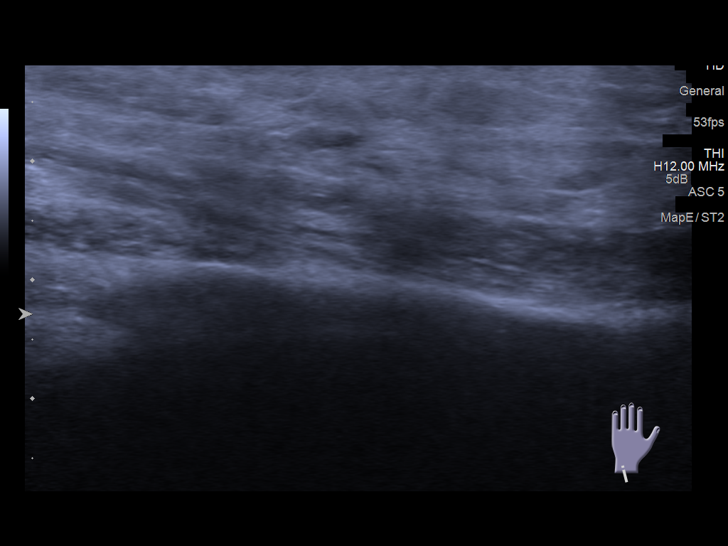
[im 13/17]
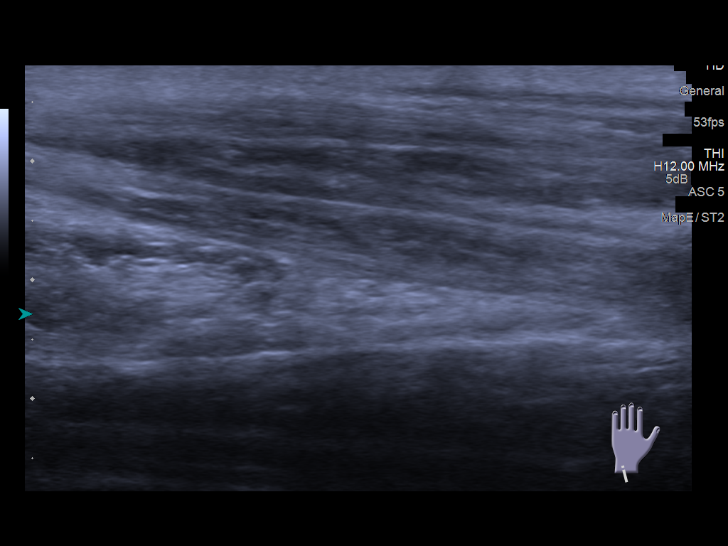
[im 14/17]
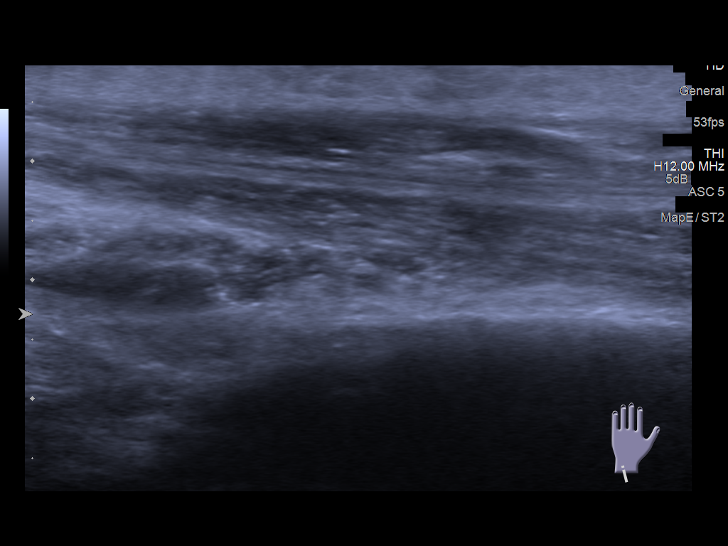
[im 16/17]
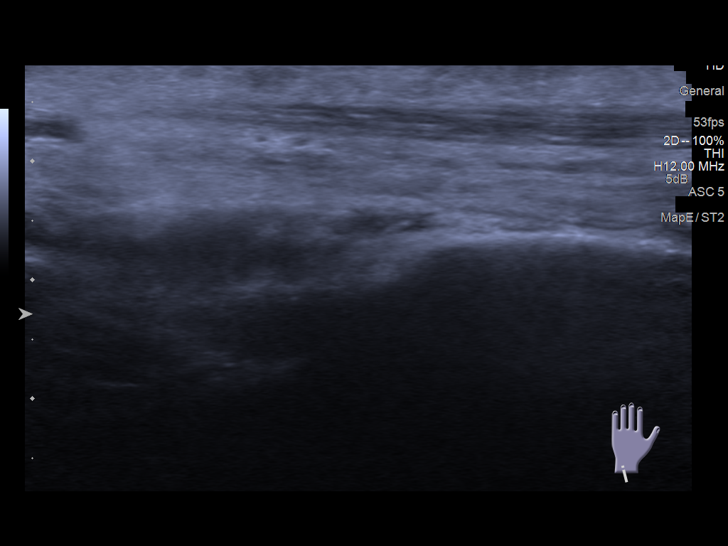
[im 17/17]
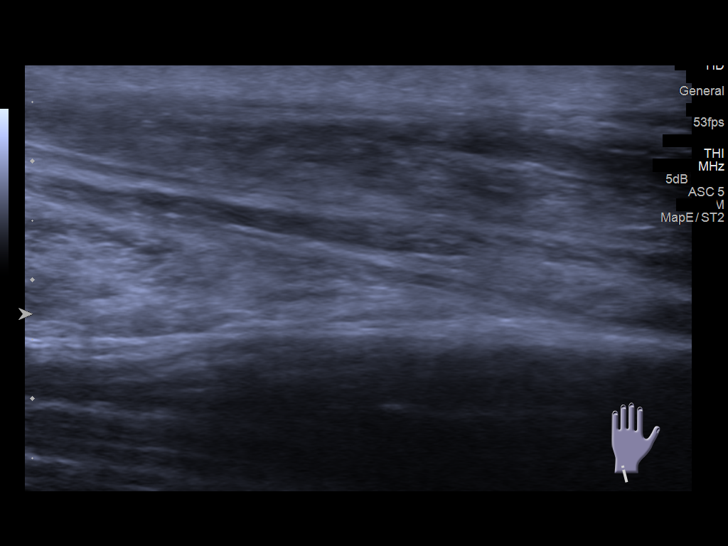

[14 of 17 positions shown; findings below may reference images not displayed]

FINDINGS: Ultrasound was performed in the regions of the antecubital fossa and
nearby regions as well as more distally in the region of the wrist.
No abnormal fluid collection or abscess is seen in these areas.
There is generalized soft tissue edema. No mass seen.
IMPRESSION: In the areas marked as clinical concern, there is generalized edema
without mass or fluid collection. No abscess appreciable. No focal
lesions appreciable by ultrasound in the areas of clinical concern
beyond soft tissue edema.
